# Patient Record
Sex: Female | Born: 1970
Health system: Southern US, Community
[De-identification: ages and names within clinical notes are randomized; demographics above are authoritative.]

## PROBLEM LIST (undated history)

## (undated) DIAGNOSIS — M1990 Unspecified osteoarthritis, unspecified site: Secondary | ICD-10-CM

## (undated) DIAGNOSIS — T7840XA Allergy, unspecified, initial encounter: Secondary | ICD-10-CM

## (undated) DIAGNOSIS — J45909 Unspecified asthma, uncomplicated: Secondary | ICD-10-CM

## (undated) DIAGNOSIS — I10 Essential (primary) hypertension: Secondary | ICD-10-CM

## (undated) DIAGNOSIS — K219 Gastro-esophageal reflux disease without esophagitis: Secondary | ICD-10-CM

## (undated) HISTORY — DX: Unspecified osteoarthritis, unspecified site: M19.90

## (undated) HISTORY — DX: Gastro-esophageal reflux disease without esophagitis: K21.9

## (undated) HISTORY — DX: Unspecified asthma, uncomplicated: J45.909

## (undated) HISTORY — PX: BUNIONECTOMY: SHX129

## (undated) HISTORY — DX: Allergy, unspecified, initial encounter: T78.40XA

## (undated) HISTORY — DX: Essential (primary) hypertension: I10

## (undated) HISTORY — PX: CYST EXCISION: SHX5701

## (undated) HISTORY — PX: OTHER SURGICAL HISTORY: SHX169

## (undated) HISTORY — PX: MENISCUS REPAIR: SHX5179

---

## 2012-06-23 DIAGNOSIS — E559 Vitamin D deficiency, unspecified: Secondary | ICD-10-CM | POA: Insufficient documentation

## 2012-06-23 DIAGNOSIS — M159 Polyosteoarthritis, unspecified: Secondary | ICD-10-CM | POA: Insufficient documentation

## 2012-06-26 HISTORY — PX: OTHER SURGICAL HISTORY: SHX169

## 2012-07-14 DIAGNOSIS — Z9884 Bariatric surgery status: Secondary | ICD-10-CM | POA: Insufficient documentation

## 2012-10-20 DIAGNOSIS — M21612 Bunion of left foot: Secondary | ICD-10-CM | POA: Insufficient documentation

## 2012-10-20 DIAGNOSIS — M797 Fibromyalgia: Secondary | ICD-10-CM | POA: Insufficient documentation

## 2013-08-16 ENCOUNTER — Encounter (INDEPENDENT_AMBULATORY_CARE_PROVIDER_SITE_OTHER): Payer: Self-pay

## 2013-08-16 ENCOUNTER — Ambulatory Visit: Payer: Self-pay

## 2013-08-16 ENCOUNTER — Ambulatory Visit (INDEPENDENT_AMBULATORY_CARE_PROVIDER_SITE_OTHER): Payer: BC Managed Care – PPO

## 2013-08-16 VITALS — BP 120/74 | HR 62 | Resp 18

## 2013-08-16 DIAGNOSIS — M79672 Pain in left foot: Secondary | ICD-10-CM

## 2013-08-16 DIAGNOSIS — M79609 Pain in unspecified limb: Secondary | ICD-10-CM

## 2013-08-16 DIAGNOSIS — M7750 Other enthesopathy of unspecified foot: Secondary | ICD-10-CM

## 2013-08-16 DIAGNOSIS — Q66229 Congenital metatarsus adductus, unspecified foot: Secondary | ICD-10-CM

## 2013-08-16 DIAGNOSIS — M2010 Hallux valgus (acquired), unspecified foot: Secondary | ICD-10-CM

## 2013-08-16 NOTE — Patient Instructions (Signed)
Bunionectomy A bunionectomy is surgery to remove a bunion. A bunion is an enlargement of the joint at the base of the big toe. It is made up of bone and soft tissue on the inside part of the joint. Over time, a painful lump appears on the inside of the joint. The big toe begins to point inward toward the second toe. New bone growth can occur and a bone spur may form. The pain eventually causes difficulty walking. A bunion usually results from inflammation caused by the irritation of poorly fitting shoes. It often begins later in life. A bunionectomy is performed when nonsurgical treatment no longer works. When surgery is needed, the extent of the procedure will depend on the degree of deformity of the foot. Your surgeon will discuss with you the different procedures and what will work best for you depending on your age and health. LET YOUR CAREGIVER KNOW ABOUT:   Previous problems with anesthetics or medicines used to numb the skin.  Allergies to dyes, iodine, foods, and/or latex.  Medicines taken including herbs, eye drops, prescription medicines (especially medicines used to "thin the blood"), aspirin and other over-the-counter medicines, and steroids (by mouth or as a cream).  History of bleeding or blood problems.  Possibility of pregnancy, if this applies.  History of blood clots in your legs and/or lungs .  Previous surgery.  Other important health problems. RISKS AND COMPLICATIONS   Infection.  Pain.  Nerve damage.  Possibility that the bunion will recur. BEFORE THE PROCEDURE  You should be present 60 minutes prior to your procedure or as directed.  PROCEDURE  Surgery is often done so that you can go home the same day (outpatient). It may be done in a hospital or in an outpatient surgical center. An anesthetic will be used to help you sleep during the procedure. Sometimes, a spinal anesthetic is used to make you numb below the waist. A cut (incision) is made over the swollen  area at the first joint of the big toe. The enlarged lump will be removed. If there is a need to reposition the bones of the big toe, this may require more than 1 incision. The bone itself may need to be cut. Screws and wires may be used in the repair. These can be removed at a later date. In severe cases, the entire joint may need to be removed and a joint replacement inserted. When done, the incision is closed with stitches (sutures). Skin adhesive strips may be added for reinforcement. They help hold the incision closed.  AFTER THE PROCEDURE  Compression bandages (dressings) are then wrapped around the wound. This helps to keep the foot in alignment and reduce swelling. Your foot will be monitored for bleeding and swelling. You will need to stay for a few hours in the recovery area before being discharged. This allows time for the anesthesia to wear off. You will be discharged home when you are awake, stable, and doing well. HOME CARE INSTRUCTIONS   You can expect to return to normal activities within 6 to 8 weeks after surgery. The foot is at increased risk for swelling for several months. When you can expect to bear weight on the operated foot will depend on the extent of your surgery. The milder the deformity, the less tissue is removed and the sooner the return to normal activity level. During the recovery period, a special shoe, boot, or cast may be worn to accommodate the surgical bandage and to help provide stability   to the foot.  Once you are home, an ice pack applied to the operative site may help with discomfort and keep swelling down. Stop using the ice if it causes discomfort.  Keep your feet raised (elevated) when possible to lessen swelling.  If you have an elastic bandage on your foot and you have numbness, tingling, or your foot becomes cold and blue, adjust the bandage to make it comfortable.  Change dressings as directed.  Keep the wound dry and clean. The wound may be washed  gently with soap and water. Gently blot dry without rubbing. Do not take baths or use swimming pools or hot tubs for 10 days, or as instructed by your caregiver.  Only take over-the-counter or prescription medicines for pain, discomfort, or fever as directed by your caregiver.  You may continue a normal diet as directed.  For activity, use crutches with no weight bearing or your orthopedic shoe as directed. Continue to use crutches or a cane as directed until you can stand without causing pain. SEEK MEDICAL CARE IF:   You have redness, swelling, bruising, or increasing pain in the wound.  There is pus coming from the wound.  You have drainage from a wound lasting longer than 1 day.  You have an oral temperature above 102 F (38.9 C).  You notice a bad smell coming from the wound or dressing.  The wound breaks open after sutures have been removed.  You develop dizzy episodes or fainting while standing.  You have persistent nausea or vomiting.  Your toes become cold.  Pain is not relieved with medicines. SEEK IMMEDIATE MEDICAL CARE IF:   You develop a rash.  You have difficulty breathing.  You develop any reaction or side effects to medicines given.  Your toes are numb or blue, or you have severe pain. MAKE SURE YOU:   Understand these instructions.  Will watch your condition.  Will get help right away if you are not doing well or get worse. Document Released: 10/01/2005 Document Revised: 01/10/2012 Document Reviewed: 11/06/2007 [facility] Patient Information 2014 [place], [place]. Bunion You have a bunion deformity of the feet. This is more common in women. It tends to be an inherited problem. Symptoms can include pain, swelling, and deformity around the great toe. Numbness and tingling may also be present. Your symptoms are often worsened by wearing shoes that cause pressure on the bunion. Changing the type of shoes you wear helps reduce symptoms. A wide shoe decreases  pressure on the bunion. An arch support may be used if you have flat feet. Avoid shoes with heels higher than two inches. This puts more pressure on the bunion. X-rays may be helpful in evaluating the severity of the problem. Other foot problems often seen with bunions include corns, calluses, and hammer toes. If the deformity or pain is severe, surgical treatment may be necessary. Keep off your painful foot as much as possible until the pain is relieved. Call your caregiver if your symptoms are worse.  SEEK IMMEDIATE MEDICAL CARE IF:  You have increased redness, pain, swelling, or other symptoms of infection. Document Released: 10/18/2005 Document Revised: 01/10/2012 Document Reviewed: 04/17/2007 [facility] Patient Information 2014 [place], [place].

## 2013-08-16 NOTE — Progress Notes (Signed)
  Subjective:    Patient ID: Elizabeth Thornton, female    DOB: 03-Aug-1971, 42 y.o.   MRN: [id number]  HPI bunions are both feet, burns and throbs and redness and hurts to wear shoes and has been going on for the last four years    Review of Systems     Objective:   Physical Exam  Constitutional: She is oriented to person, place, and time. She appears well-developed and well-nourished.  Cardiovascular:  Pulses:      Dorsalis pedis pulses are 2+ on the right side, and 2+ on the left side.       Posterior tibial pulses are 2+ on the right side, and 2+ on the left side.  Capillary refill timed 3-4 seconds all digits bilateral. Skin temperature warm bilateral turgor normal. There is no edema or varicosities noted.  Musculoskeletal:  Orthopedic biomechanical exam lower extremity reveals mild to moderate HAV deformity with prominence of the first MTP joint medially. This painful and tender with enclosed shoes, walking, and on range of motion evaluation. The foot is also significant for clinical met adductus findings patient has a generalized C-shaped foot appearance. Should also note patient has a genu valgum deformity associated with a history of knee problems as well.  Neurological: She is alert and oriented to person, place, and time. She has normal strength.  Epicritic and proprioceptive sensations intact and symmetric bilateral. DTRs not elicited. There is normal plantar response bilateral.  Skin: Skin is warm and dry. No cyanosis. Nails show no clubbing.  Skin color pigment normal hair growth diminished absent bilateral. No open wounds or lacerations noted. Slight erythema the first MTP. Bilateral  Psychiatric: She has a normal mood and affect. Her behavior is normal.          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 Patient also significant metatarsus adductus deformity bilateral. Generalized pain around first MTP area bilateral with  capsulitis and mild last arthropathy. These deviation and subluxation of the sesamoids and radiographs. At this time discussed options possible simple bunionectomy Rosten bunionectomy may alleviate her problems as far as the bunion pain however we'll not address the method that is deformities. Should note the patient has a windswept appearance to her toes with medial deviation of all digits except for hallux which or laterally deviated. Literature on bunion and surgery is dispensed. Patient will followup on an as-needed basis understands that surgery will likely need to be out of work for 6 weeks. Will call us when she is ready for surgical consult. Did suggest using topical pain application such as a salon pas, or Aspercreme.  Alvan Dame DPM

## 2014-03-06 ENCOUNTER — Telehealth: Payer: Self-pay | Admitting: *Deleted

## 2014-03-06 NOTE — Telephone Encounter (Signed)
He said I have bunions on both feet and that my feet are c-shaped.  I'm ready to do something about this.  I have some questions.  Do I need to set up another appointment?  What will insurance cover?  Give me a call back.

## 2014-03-07 NOTE — Telephone Encounter (Signed)
Patient needs to come in and see the doctor/lisa

## 2014-03-13 ENCOUNTER — Telehealth: Payer: Self-pay | Admitting: *Deleted

## 2014-03-13 NOTE — Telephone Encounter (Signed)
I want to schedule surgery.

## 2014-03-15 NOTE — Telephone Encounter (Signed)
Left message for pt to call me will need to come in and see Dr Ralene CorkSikora again to discuss surgery and sign consents

## 2014-03-20 NOTE — Telephone Encounter (Signed)
03/20/14 4:39pm  Attempted to call pt again left 2nd message to call office to set up appt

## 2014-03-22 NOTE — Telephone Encounter (Signed)
Patient called and scheduled appointment to see Dr Ralene Cork

## 2014-04-04 ENCOUNTER — Ambulatory Visit (INDEPENDENT_AMBULATORY_CARE_PROVIDER_SITE_OTHER): Payer: BC Managed Care – PPO

## 2014-04-04 VITALS — BP 122/81 | HR 59 | Resp 18

## 2014-04-04 DIAGNOSIS — M778 Other enthesopathies, not elsewhere classified: Secondary | ICD-10-CM

## 2014-04-04 DIAGNOSIS — M779 Enthesopathy, unspecified: Secondary | ICD-10-CM

## 2014-04-04 DIAGNOSIS — M79609 Pain in unspecified limb: Secondary | ICD-10-CM

## 2014-04-04 DIAGNOSIS — M79672 Pain in left foot: Secondary | ICD-10-CM

## 2014-04-04 DIAGNOSIS — Q66229 Congenital metatarsus adductus, unspecified foot: Secondary | ICD-10-CM

## 2014-04-04 DIAGNOSIS — M7750 Other enthesopathy of unspecified foot: Secondary | ICD-10-CM

## 2014-04-04 DIAGNOSIS — M2010 Hallux valgus (acquired), unspecified foot: Secondary | ICD-10-CM

## 2014-04-04 NOTE — Progress Notes (Signed)
   Subjective:    Patient ID: Elizabeth Thornton, female    DOB: January 24, 1971, 43 y.o.   MRN: [id number]  HPI my left foot is worse but they both hurt and throbbing and numbness and tingling and there is no swelling and I want to talk about surgery    Review of Systems no new findings or systemic changes noted. Patient continues to have bunion deformity prominent on both feet as well as significant metatarsus adductus deformity contributing to the overall deformity of the foot.    Objective:   Physical Exam Neurovascular status is intact pedal pulses palpable patient continues to have painful bunion deformities right is actually slightly worse than left however left more painful or symptomatic at this time. Patient does have some generalized pain in the Lisfranc area dorsal midfoot and rear foot area time for gait ambulation been going on for many years however is diffuse and otherwise tolerated by patient however most significant pain is the bump in medial eminence pain in the first metatarsal for visual joint bilateral left more so than right x-rays confirm elevated I am angle greater than 12 deviation of sesamoids there is significant met adductus as well however we discussed complete treatment would involve adductus repair as well as bunion repair however it and the prolonged recovery nonweightbearing up to 3 months the boot and significant down time however suspicion or symptomology the only that of bunion pain recommendation is times of the [facility] bunionectomy with isolated first metatarsal osteotomy and pin or screw fixation to be done under IV sedation outpatient basis with local and regional anesthetic block. Patient is amendable to the lateral procedure including Austin bunionectomy be done an outpatient for correction of painful bunion deformity of the left foot.       Assessment & Plan:  Assessment possible abductovalgus deformity as well as metatarsus adductus deformity both feet left more  painful tender symptomatic at this time does have persistent capsulitis of the first MTP joint. Consent form for [facility] with screw or pin fixations reviewed and signed at this time all questions asked medication are answered there no complications and surgery scheduled her convenience with primary postop followup as far as pain management patient is status post GI gastric bypass and has allergy to codeine including derivatives. Cannot take codeine hydrocodone or oxycodone. Will discuss with her GI doctor the possibilities of using tramadol or short-term of meloxicam versus plain Tylenol for postop pain management patient will inform us on the choices at the time of surgery. At this time and consent form is reviewed and signed all questions asked medication are answered and surgery scheduled her convenience. She understands she'll be in air fracture boot or candidate for approximately one-month duration 5-6 weeks total she will probably be off her work activities for 6-8 week duration to next  [employer] DPM

## 2014-04-04 NOTE — Patient Instructions (Signed)
Pre-Operative Instructions  Congratulations, you have decided to take an important step to improving your quality of life.  You can be assured that the doctors of [facility] will be with you every step of the way.  1. Plan to be at the surgery center/hospital at least 1 (one) hour prior to your scheduled time unless otherwise directed by the surgical center/hospital staff.  You must have a responsible adult accompany you, remain during the surgery and drive you home.  Make sure you have directions to the surgical center/hospital and know how to get there on time. 2. For hospital based surgery you will need to obtain a history and physical form from your family physician within 1 month prior to the date of surgery- we will give you a form for you primary physician.  3. We make every effort to accommodate the date you request for surgery.  There are however, times where surgery dates or times have to be moved.  We will contact you as soon as possible if a change in schedule is required.   4. No Aspirin/Ibuprofen for one week before surgery.  If you are on aspirin, any non-steroidal anti-inflammatory medications (Mobic, Aleve, Ibuprofen) you should stop taking it 7 days prior to your surgery.  You make take Tylenol  For pain prior to surgery.  5. Medications- If you are taking daily heart and blood pressure medications, seizure, reflux, allergy, asthma, anxiety, pain or diabetes medications, make sure the surgery center/hospital is aware before the day of surgery so they may notify you which medications to take or avoid the day of surgery. 6. No food or drink after midnight the night before surgery unless directed otherwise by surgical center/hospital staff. 7. No alcoholic beverages 24 hours prior to surgery.  No smoking 24 hours prior to or 24 hours after surgery. 8. Wear loose pants or shorts- loose enough to fit over bandages, boots, and casts. 9. No slip on shoes, sneakers are best. 10. Bring  your boot with you to the surgery center/hospital.  Also bring crutches or a walker if your physician has prescribed it for you.  If you do not have this equipment, it will be provided for you after surgery. 11. If you have not been contracted by the surgery center/hospital by the day before your surgery, call to confirm the date and time of your surgery. 12. Leave-time from work may vary depending on the type of surgery you have.  Appropriate arrangements should be made prior to surgery with your employer. 13. Prescriptions will be provided immediately following surgery by your doctor.  Have these filled as soon as possible after surgery and take the medication as directed. 14. Remove nail polish on the operative foot. 15. Wash the night before surgery.  The night before surgery wash the foot and leg well with the antibacterial soap provided and water paying special attention to beneath the toenails and in between the toes.  Rinse thoroughly with water and dry well with a towel.  Perform this wash unless told not to do so by your physician.  Enclosed: 1 Ice pack (please put in freezer the night before surgery)   1 Hibiclens skin cleaner   Pre-op Instructions  If you have any questions regarding the instructions, do not hesitate to call our office.  [place]: [address] [place], [place] [address] [phone]  [place]: [address] [place], [place] [address] [phone]  [place]: [address]  [place], [place] [address] [phone]  Dr. Shenita Trego   Tuchman DPM, Dr. Cristie Hem DPM Dr. Alvan Dame DPM, Dr. Ardelle Anton DPM, Dr. Marlowe Aschoff DPM     Check with her GI doctor or physicians managing her weight is doing her bariatric care for possible choices for postop pain management Consider the options of using meloxicam 15 mg once daily as and mild NSAIDs for short-term For more severe pain the option using tramadol 50 mg one every 6 hours when necessary pain For more mild or  generalized pain plain Tylenol would be acceptable.

## 2014-06-03 DIAGNOSIS — M2010 Hallux valgus (acquired), unspecified foot: Secondary | ICD-10-CM

## 2014-06-04 ENCOUNTER — Telehealth: Payer: Self-pay

## 2014-06-04 NOTE — Telephone Encounter (Signed)
Spoke with pt regarding post operative status,she states that she is managing her pain very well, and has foot elevated. Advised to wear the boot at all times and keep sterile dressing dry

## 2014-06-06 ENCOUNTER — Ambulatory Visit (INDEPENDENT_AMBULATORY_CARE_PROVIDER_SITE_OTHER): Payer: BC Managed Care – PPO

## 2014-06-06 VITALS — BP 116/78 | HR 76 | Resp 18

## 2014-06-06 DIAGNOSIS — Z09 Encounter for follow-up examination after completed treatment for conditions other than malignant neoplasm: Secondary | ICD-10-CM

## 2014-06-06 DIAGNOSIS — M778 Other enthesopathies, not elsewhere classified: Secondary | ICD-10-CM

## 2014-06-06 DIAGNOSIS — Q66229 Congenital metatarsus adductus, unspecified foot: Secondary | ICD-10-CM

## 2014-06-06 DIAGNOSIS — M2010 Hallux valgus (acquired), unspecified foot: Secondary | ICD-10-CM

## 2014-06-06 DIAGNOSIS — M779 Enthesopathy, unspecified: Secondary | ICD-10-CM

## 2014-06-06 DIAGNOSIS — M7750 Other enthesopathy of unspecified foot: Secondary | ICD-10-CM

## 2014-06-06 NOTE — Progress Notes (Signed)
Subjective:    Patient ID: Elizabeth Thornton, female    DOB: Aug 03, 1971, 43 y.o.   MRN: [id number]  HPI I HAD SURGERY ON 06/03/14 ON MY LEFT FOOT AND IT IS OK AND THE TRAMADOL MADE ME ITCH AND THE ANTIBOTIC MADE MY DIZZY AND NAUSEA    Review of Systems no new findings or systemic changes noted     Objective:   Physical Exam Neurovascular status is intact pedal pulses palpable incision clean dry well coapted dressings were intact and dry patient is been nonweightbearing having misunderstood instructions is using crutches and nonweightbearing at this time patient by she can weight-bear with the air fracture boot in place may discontinue use of the crutches. X-rays reveal good position of the osteotomy intact fixation good alignment of the osteotomy noted clinically incision well coapted good range of motion for flexion plantar flexion the hallux is noted toe appears rectus position or only complaint having some tightness a bandage along the fifth metatarsal area causing irritation. Also is having suggested from her pain medications and antibiotic medications which are RE been discontinued. Mild edema and ecchymosis noted consistent with postop course       Assessment & Plan:  Assessment good postop progress presto compressive dressing reapplied at this time reappointed in one week for dressing change in followup maintain Tylenol as needed for pain elevate and use ice pack when necessary remove the boot when needed allergies to maintain the boot for ambulating or walking or during sleep. Recheck in one week for postop followup  Alvan Dame DPM

## 2014-06-06 NOTE — Patient Instructions (Signed)

## 2014-06-07 NOTE — Progress Notes (Signed)
Dr Ralene CorkSikora performed a Left Austin bunionectomy on 06/03/14, he prescribed tramadol 50mg  #50 1-2 tabs q6hrs prn pain Cephalexin500mg  #10 Q6hrs

## 2014-06-13 ENCOUNTER — Ambulatory Visit (INDEPENDENT_AMBULATORY_CARE_PROVIDER_SITE_OTHER): Payer: BC Managed Care – PPO

## 2014-06-13 VITALS — BP 109/67 | HR 57 | Resp 18

## 2014-06-13 DIAGNOSIS — Z09 Encounter for follow-up examination after completed treatment for conditions other than malignant neoplasm: Secondary | ICD-10-CM

## 2014-06-13 DIAGNOSIS — M2010 Hallux valgus (acquired), unspecified foot: Secondary | ICD-10-CM

## 2014-06-13 NOTE — Patient Instructions (Signed)

## 2014-06-13 NOTE — Progress Notes (Signed)
Subjective:    Patient ID: Elizabeth Thornton, female    DOB: 10/14/1971, 43 y.o.   MRN: [id number]  HPI I HAD SURGERY ON 06/03/14 ON MY LEFT FOOT AND IT IS DOING GOOD      Review of Systems no new findings or systemic changes noted    Objective:   Physical Exam Lower extremity objective findings reveal neurovascular status intact good postop progress incision clean dry well coapted sutures intact as was removed at this time patient may resume normal bathing and hygiene suggested Neosporin cocoa butter to the incision or the dermal as needed continue with active passive range of motion exercises of the great toe joint at least 100 toe pushup today. No discharge no drainage no sign of dehiscence good range of motion proximally 50 dorsiflexion 510 plantar flexion tenderness the joint consistent with postop course and edema      Assessment & Plan:  Assessment good postop progress presto dressing removed dispensed anklet to maintain compression of the foot during the day we've the anklet off at night continue with range of motion exercises recheck in 4 weeks for long-term followup x-ray plan to D/C boot after next visit.  Alvan Dame DPM

## 2014-07-11 ENCOUNTER — Ambulatory Visit (INDEPENDENT_AMBULATORY_CARE_PROVIDER_SITE_OTHER): Payer: BC Managed Care – PPO

## 2014-07-11 VITALS — BP 97/62 | HR 66 | Resp 18

## 2014-07-11 DIAGNOSIS — M779 Enthesopathy, unspecified: Secondary | ICD-10-CM

## 2014-07-11 DIAGNOSIS — Z09 Encounter for follow-up examination after completed treatment for conditions other than malignant neoplasm: Secondary | ICD-10-CM

## 2014-07-11 DIAGNOSIS — M7750 Other enthesopathy of unspecified foot: Secondary | ICD-10-CM

## 2014-07-11 DIAGNOSIS — M778 Other enthesopathies, not elsewhere classified: Secondary | ICD-10-CM

## 2014-07-11 DIAGNOSIS — M2010 Hallux valgus (acquired), unspecified foot: Secondary | ICD-10-CM

## 2014-07-11 NOTE — Progress Notes (Signed)
   Subjective:    Patient ID: Elizabeth Thornton, female    DOB: 1970-12-22, 43 y.o.   MRN: [id number]  HPI I AM DOING GOOD ON MY LEFT FOOT AND THERE IS SOME NUMBNESS AND SWELLING AND I HAVE NOTICED THAT MY FOOT GETS A LITTLE PURPLISH COLOR WHEN I HAVE MY LEG DOWN BUT IF I HAVE IT UP I DON'T SEE THE PURPLISH COLOR    Review of Systems no new findings or systemic changes noted     Objective:   Physical Exam Neurovascular status is intact pedal pulses are palpable epicritic and proprioceptive sensations intact incision well coapted still slight erythema the incision area and some slight paresthesia consistent with postop course fungal numbness just distal to the incision of the hallux is noted pain or discomfort has excellent range of motion proximally 50 dorsiflexion or more 10 plantar flexion no crepitus continue with active and passive range of motion exercises as instructed x-rays reveal good alignment of the osteotomy and K wire fixation patient does have significant met adductus which is noted and she is aware that aspect likely no change        Assessment & Plan:  Assessment good postop progress following Austin bunionectomy left foot great toe joint is function will continue with active passive range of motion exercises discontinue air fracture boot at this time may resume walking tennis or athletic shoes using a Brooks running shoe can return to work with the next 2 weeks as scheduled at this time patient is advised no running ballistic activities or high-impact no high heels or dress shoes for leaks 2 more months maintain A. shoes athletic or walking shoe for the next 2 months scheduled for followup at this time  Harriet Masson DPM

## 2014-07-11 NOTE — Patient Instructions (Signed)

## 2014-09-13 ENCOUNTER — Ambulatory Visit (INDEPENDENT_AMBULATORY_CARE_PROVIDER_SITE_OTHER): Payer: BC Managed Care – PPO

## 2014-09-13 VITALS — BP 103/64 | HR 56 | Resp 12

## 2014-09-13 DIAGNOSIS — M2012 Hallux valgus (acquired), left foot: Secondary | ICD-10-CM

## 2014-09-13 DIAGNOSIS — Z09 Encounter for follow-up examination after completed treatment for conditions other than malignant neoplasm: Secondary | ICD-10-CM

## 2014-09-13 DIAGNOSIS — M2010 Hallux valgus (acquired), unspecified foot: Secondary | ICD-10-CM

## 2014-09-13 DIAGNOSIS — Q66229 Congenital metatarsus adductus, unspecified foot: Secondary | ICD-10-CM

## 2014-09-13 DIAGNOSIS — Q662 Congenital metatarsus (primus) varus: Secondary | ICD-10-CM

## 2014-09-13 NOTE — Progress Notes (Signed)
Subjective:    Patient ID: Elizabeth Thornton, female    DOB: 1971/06/27, 43 y.o.   MRN: [id number]  HPI   ''LT FOOT STILL SORE BUT IS DOING MUCH BETTER.''  Review of Systems no new findings or systemic changes noted still some mild postoperative edema present     Objective:   Physical Exam Neurovascular status is intact and unchanged patient still has some slight met adductus bunion correction is adequate incision is well coapted there is still some tenderness along the MTP joint right great toe joint mild postoperative edema still present x-rays reveal good alignment of the osteotomy intact fixation no displacement has excellent range of motion approximately 43 year old more degrees dorsiflexion 10-15 plantar flexion with no crepitus although there is tenderness on palpation. Patient Elizabeth Thornton just worked 4 consecutive 12 hour shifts so her feet are deathly swollen and somewhat more tender today than usual. Patient isfunctioning without restrictions       Assessment & Plan:  Assessment good postop progress fungal [place] bunionectomy left foot maintain good stable shoes may be candidate for orthoses in the future if needed otherwise wearing Birkenstock's or crocs or something similar is using occasional anti-inflammatory or NSAID as needed for pain is charge to an as-needed basis for any future follow-up  Elizabeth Thornton DPM  Subjective:    Patient ID: Elizabeth Thornton, female    DOB: 11/27/1970, 43 y.o.   MRN: [id number]  HPI   ''LT FOOT STILL SORE BUT IS DOING MUCH BETTER.''  Review of Systems no new findings or systemic changes noted still some mild postoperative edema present     Objective:   Physical Exam Neurovascular status is intact and unchanged patient still has some slight met adductus bunion correction is adequate incision is well coapted there is still some tenderness along the MTP joint right great toe joint mild postoperative edema still present x-rays reveal good alignment of the osteotomy intact fixation no displacement has excellent range of motion approximately 58-year-old more degrees dorsiflexion 10-15 plantar flexion with no crepitus although there is tenderness on palpation. Patient Elizabeth Thornton just worked 4 consecutive 12 hour shifts so her feet are deathly swollen and somewhat more tender today than usual. Patient isfunctioning without restrictions       Assessment & Plan:  Assessment good postop progress fungal [place] bunionectomy left foot maintain good stable shoes may be candidate for orthoses in the future if needed otherwise wearing Birkenstock's or crocs or something similar is using occasional anti-inflammatory or NSAID as needed for pain is charge to an as-needed basis for any future follow-up    DPM 

## 2014-09-13 NOTE — Patient Instructions (Signed)

## 2016-06-22 ENCOUNTER — Other Ambulatory Visit: Payer: Self-pay | Admitting: Occupational Medicine

## 2016-06-22 ENCOUNTER — Ambulatory Visit: Payer: Self-pay

## 2016-06-22 DIAGNOSIS — M25561 Pain in right knee: Secondary | ICD-10-CM

## 2016-08-02 ENCOUNTER — Ambulatory Visit (INDEPENDENT_AMBULATORY_CARE_PROVIDER_SITE_OTHER): Payer: BLUE CROSS/BLUE SHIELD | Admitting: Orthopedic Surgery

## 2016-08-02 DIAGNOSIS — M1711 Unilateral primary osteoarthritis, right knee: Secondary | ICD-10-CM | POA: Diagnosis not present

## 2016-08-03 ENCOUNTER — Other Ambulatory Visit (INDEPENDENT_AMBULATORY_CARE_PROVIDER_SITE_OTHER): Payer: Self-pay | Admitting: Orthopedic Surgery

## 2016-08-03 ENCOUNTER — Ambulatory Visit (HOSPITAL_BASED_OUTPATIENT_CLINIC_OR_DEPARTMENT_OTHER)
Admission: RE | Admit: 2016-08-03 | Discharge: 2016-08-03 | Disposition: A | Discharge status: Home / Self Care | Payer: BLUE CROSS/BLUE SHIELD | Source: Ambulatory Visit

## 2016-08-03 ENCOUNTER — Emergency Department (HOSPITAL_COMMUNITY)
Admission: EM | Admit: 2016-08-03 | Discharge: 2016-08-03 | Disposition: A | Discharge status: Home / Self Care | Payer: BLUE CROSS/BLUE SHIELD | Attending: Emergency Medicine | Admitting: Emergency Medicine

## 2016-08-03 ENCOUNTER — Encounter (HOSPITAL_COMMUNITY): Payer: Self-pay | Admitting: Emergency Medicine

## 2016-08-03 DIAGNOSIS — Z79899 Other long term (current) drug therapy: Secondary | ICD-10-CM | POA: Insufficient documentation

## 2016-08-03 DIAGNOSIS — I82431 Acute embolism and thrombosis of right popliteal vein: Secondary | ICD-10-CM

## 2016-08-03 DIAGNOSIS — M79606 Pain in leg, unspecified: Secondary | ICD-10-CM | POA: Diagnosis not present

## 2016-08-03 DIAGNOSIS — J45909 Unspecified asthma, uncomplicated: Secondary | ICD-10-CM | POA: Insufficient documentation

## 2016-08-03 DIAGNOSIS — M7989 Other specified soft tissue disorders: Principal | ICD-10-CM

## 2016-08-03 DIAGNOSIS — I82401 Acute embolism and thrombosis of unspecified deep veins of right lower extremity: Secondary | ICD-10-CM | POA: Diagnosis not present

## 2016-08-03 DIAGNOSIS — O2230 Deep phlebothrombosis in pregnancy, unspecified trimester: Secondary | ICD-10-CM

## 2016-08-03 LAB — BASIC METABOLIC PANEL
ANION GAP: 4 — AB (ref 5–15)
BUN: 11 mg/dL (ref 6–20)
CALCIUM: 9.4 mg/dL (ref 8.9–10.3)
CHLORIDE: 107 mmol/L (ref 101–111)
CO2: 27 mmol/L (ref 22–32)
Creatinine, Ser: 0.69 mg/dL (ref 0.44–1.00)
GFR calc non Af Amer: >60 mL/min (ref >60)
Glucose, Bld: 80 mg/dL (ref 65–99)
Potassium: 4 mmol/L (ref 3.5–5.1)
Sodium: 138 mmol/L (ref 135–145)

## 2016-08-03 LAB — I-STAT BETA HCG BLOOD, ED (MC, WL, AP ONLY): I-stat hCG, quantitative: <5 m[IU]/mL (ref <5)

## 2016-08-03 LAB — CBC
HEMATOCRIT: 36.1 % (ref 36.0–46.0)
HEMOGLOBIN: 11.8 g/dL — AB (ref 12.0–15.0)
MCH: 28.2 pg (ref 26.0–34.0)
MCHC: 32.7 g/dL (ref 30.0–36.0)
MCV: 86.4 fL (ref 78.0–100.0)
Platelets: 444 10*3/uL — ABNORMAL HIGH (ref 150–400)
RBC: 4.18 MIL/uL (ref 3.87–5.11)
RDW: 14.7 % (ref 11.5–15.5)
WBC: 8 10*3/uL (ref 4.0–10.5)

## 2016-08-03 LAB — PROTIME-INR
INR: 1.06
PROTHROMBIN TIME: 13.9 s (ref 11.4–15.2)

## 2016-08-03 MED ORDER — RIVAROXABAN (XARELTO) VTE STARTER PACK (15 & 20 MG): 15.0000 mg | ORAL_TABLET | Freq: Two times a day (BID) | ORAL | 0 refills | Status: DC

## 2016-08-03 NOTE — ED Triage Notes (Addendum)
Pt states she tore her meniscus in her right knee 8/21. Pt states since then, she has been having right lower leg swelling and pain. Pt had DVT study here today and pos for DVT.

## 2016-08-03 NOTE — ED Provider Notes (Signed)
[facility]-EMERGENCY DEPT Provider Note   CSN: [id number] Arrival date & time: 08/03/16  1718     History   Chief Complaint Chief Complaint  Patient presents with  . DVT    HPI Elizabeth Thornton is a 45 y.o. female with DVT of her right lower extremity. She states that she has a meniscus tear in mid August. She has been seen by Dr. August Saucerean. She has had some ongoing swelling of her right lower leg now has some pain in the right medial thigh. She was sent for a Doppler study today and it revealed a. DVT of the right lower extremity and the right popliteal, posterior tibial, and peroneal veins. She has not had any chest pain or dyspnea. She has not had any previous D DTs. Her mother had antithrombin III deficiency and she has been tested in the past and it was negative.   HPI  Past Medical History:  Diagnosis Date  . Allergy   . Arthritis   . Asthma     There are no active problems to display for this patient.   Past Surgical History:  Procedure Laterality Date  . gastric bipass    . 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 Authorizing Provider  acetaminophen (TGT CHILDRENS ACETAMINOPHEN) 160 MG/5ML suspension Take by mouth. 06/23/12   Historical Provider, MD  albuterol (VENTOLIN HFA) 108 (90 BASE) MCG/ACT inhaler Frequency:PRN   Dosage:90   MCG  Instructions:  Note:Dose: 90 MCG 05/08/12   Historical Provider, MD  calcium citrate-vitamin D (CITRACAL+D) 315-200 MG-UNIT per tablet Take by mouth. 06/05/12   Historical Provider, MD  Cholecalciferol (VITAMIN D-1000 MAX ST) 1000 UNITS tablet Take by mouth. 05/08/12   Historical Provider, MD  Cyanocobalamin (VITAMIN B-12 CR) 1000 MCG TBCR Take by mouth. 10/20/12   Historical Provider, MD  gabapentin (NEURONTIN) 300 MG capsule  07/26/13   Historical Provider, MD  multivitamin-iron-minerals-folic acid (CENTRUM)  chewable tablet Frequency:BID   Dosage:0.0     Instructions:  Note:Dose: 1 10/20/12   Historical Provider, MD  omeprazole (PRILOSEC) 20 MG capsule  08/03/13   Historical Provider, MD  omeprazole (PRILOSEC) 20 MG capsule Take 20 mg by mouth. 04/19/14   Historical Provider, MD    Family History Family History  Problem Relation Age of Onset  . Cancer Mother     Social History Social History  Substance Use Topics  . Smoking status: Never Smoker  . Smokeless tobacco: Never Used  . Alcohol use No     Allergies   Codeine and Eggs or egg-derived products   Review of Systems Review of Systems  All other systems reviewed and are negative.    Physical Exam Updated Vital Signs BP 122/72 (BP Location: Right Arm)   Pulse 61   Temp 98.2 F (36.8 C) (Oral)   Resp 17   Ht 5\' 2"  (1.575 m)   Wt 78.5 kg   LMP 07/19/2016   SpO2 100%   BMI 31.64 kg/m   Physical Exam  Constitutional: She is oriented to person, place, and time. She appears well-developed and well-nourished.  HENT:  Head: Normocephalic.  Eyes: Pupils are equal, round, and reactive to light.  Neck: Normal range of motion.  Cardiovascular: Normal rate and regular rhythm.   Pulmonary/Chest: Effort normal and breath  sounds normal.  Abdominal: Soft. Bowel sounds are normal.  Musculoskeletal: Normal range of motion.  Right calf swollen with mild ttp, dp intact  Neurological: She is alert and oriented to person, place, and time.  Skin: Skin is warm. Capillary refill takes less than 2 seconds.  Psychiatric: She has a normal mood and affect.  Nursing note and vitals reviewed.    ED Treatments / Results  Labs (all labs ordered are listed, but only abnormal results are displayed) Labs Reviewed  CBC  BASIC METABOLIC PANEL  PROTIME-INR  I-STAT BETA HCG BLOOD, ED ([facility], [facility], [facility] ONLY)    EKG  EKG Interpretation None       Radiology No results found.  Procedures Procedures (including critical care  time)  Medications Ordered in ED Medications - No data to display   Initial Impression / Assessment and Plan / ED Course  I have reviewed the triage vital signs and the nursing notes.  Pertinent labs & imaging results that were available during my care of the patient were reviewed by me and considered in my medical decision making (see chart for details).  Clinical Course    45 year old female with DVT right lower extremity. Will check labs and if normal will start rivoraxaban an outpatient follow-up with her primary care physician  Final Clinical Impressions(s) / ED Diagnoses   Final diagnoses:  DVT (deep vein thrombosis) in pregnancy [facility])    New Prescriptions New Prescriptions   No medications on file     Margarita Grizzle, MD 08/04/16 1544

## 2016-08-03 NOTE — Progress Notes (Signed)
*  PRELIMINARY RESULTS* Vascular Ultrasound Right lower extremity venous duplex has been completed.  Preliminary findings: DVT noted in the right popliteal, posterior tibial, and peroneal veins.   Called results to Dr. August Saucerean. He instructed that patient go to ED for evaluation. 5:11 PM    Elizabeth DemarkJill Eunice, RDMS, RVT  08/03/2016

## 2016-08-05 ENCOUNTER — Ambulatory Visit (INDEPENDENT_AMBULATORY_CARE_PROVIDER_SITE_OTHER): Payer: BLUE CROSS/BLUE SHIELD | Admitting: Orthopedic Surgery

## 2016-08-05 DIAGNOSIS — M1711 Unilateral primary osteoarthritis, right knee: Secondary | ICD-10-CM | POA: Diagnosis not present

## 2016-08-11 ENCOUNTER — Ambulatory Visit (INDEPENDENT_AMBULATORY_CARE_PROVIDER_SITE_OTHER): Payer: BLUE CROSS/BLUE SHIELD | Admitting: Orthopedic Surgery

## 2016-08-11 DIAGNOSIS — M1711 Unilateral primary osteoarthritis, right knee: Secondary | ICD-10-CM

## 2016-08-18 ENCOUNTER — Ambulatory Visit (INDEPENDENT_AMBULATORY_CARE_PROVIDER_SITE_OTHER): Payer: BLUE CROSS/BLUE SHIELD | Admitting: Orthopedic Surgery

## 2016-08-18 DIAGNOSIS — M1711 Unilateral primary osteoarthritis, right knee: Secondary | ICD-10-CM

## 2016-08-23 ENCOUNTER — Ambulatory Visit (INDEPENDENT_AMBULATORY_CARE_PROVIDER_SITE_OTHER): Payer: Self-pay | Admitting: Orthopedic Surgery

## 2016-08-24 ENCOUNTER — Ambulatory Visit: Payer: Self-pay | Admitting: Nurse Practitioner

## 2016-08-26 ENCOUNTER — Encounter: Payer: Self-pay | Admitting: Nurse Practitioner

## 2016-08-26 ENCOUNTER — Ambulatory Visit (INDEPENDENT_AMBULATORY_CARE_PROVIDER_SITE_OTHER): Payer: BLUE CROSS/BLUE SHIELD | Admitting: Nurse Practitioner

## 2016-08-26 VITALS — BP 116/70 | HR 64 | Temp 98.4°F | Resp 16 | Ht 62.0 in | Wt 175.0 lb

## 2016-08-26 DIAGNOSIS — Z23 Encounter for immunization: Secondary | ICD-10-CM | POA: Diagnosis not present

## 2016-08-26 DIAGNOSIS — M797 Fibromyalgia: Secondary | ICD-10-CM

## 2016-08-26 DIAGNOSIS — I824Z1 Acute embolism and thrombosis of unspecified deep veins of right distal lower extremity: Secondary | ICD-10-CM

## 2016-08-26 DIAGNOSIS — M1712 Unilateral primary osteoarthritis, left knee: Secondary | ICD-10-CM | POA: Insufficient documentation

## 2016-08-26 DIAGNOSIS — I82409 Acute embolism and thrombosis of unspecified deep veins of unspecified lower extremity: Secondary | ICD-10-CM | POA: Insufficient documentation

## 2016-08-26 DIAGNOSIS — Z86718 Personal history of other venous thrombosis and embolism: Secondary | ICD-10-CM | POA: Insufficient documentation

## 2016-08-26 MED ORDER — RIVAROXABAN 20 MG PO TABS: 20.0000 mg | ORAL_TABLET | Freq: Every day | ORAL | 3 refills | Status: DC
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
## 2016-08-26 NOTE — Progress Notes (Signed)
Pre visit review using our clinic review tool, if applicable. No additional management support is needed unless otherwise documented below in the visit note. 
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
## 2016-08-26 NOTE — Patient Instructions (Signed)
You will be called with appt for repeat doppler. Continue xarelto once a day.  Rivaroxaban oral tablets What is this medicine? RIVAROXABAN (ri va ROX a ban) is an anticoagulant (blood thinner). It is used to treat blood clots in the lungs or in the veins. It is also used after knee or hip surgeries to prevent blood clots. It is also used to lower the chance of stroke in people with a medical condition called atrial fibrillation. This medicine may be used for other purposes; ask your health care provider or pharmacist if you have questions. What should I tell my health care provider before I take this medicine? They need to know if you have any of these conditions: -bleeding disorders -bleeding in the brain -blood in your stools (black or tarry stools) or if you have blood in your vomit -history of stomach bleeding -kidney disease -liver disease -low blood counts, like low white cell, platelet, or red cell counts -recent or planned spinal or epidural procedure -take medicines that treat or prevent blood clots -an unusual or allergic reaction to rivaroxaban, other medicines, foods, dyes, or preservatives -pregnant or trying to get pregnant -breast-feeding How should I use this medicine? Take this medicine by mouth with a glass of water. Follow the directions on the prescription label. Take your medicine at regular intervals. Do not take it more often than directed. Do not stop taking except on your doctor's advice. Stopping this medicine may increase your risk of a blood clot. Be sure to refill your prescription before you run out of medicine. If you are taking this medicine after hip or knee replacement surgery, take it with or without food. If you are taking this medicine for atrial fibrillation, take it with your evening meal. If you are taking this medicine to treat blood clots, take it with food at the same time each day. If you are unable to swallow your tablet, you may crush the tablet  and mix it in applesauce. Then, immediately eat the applesauce. You should eat more food right after you eat the applesauce containing the crushed tablet. Talk to your pediatrician regarding the use of this medicine in children. Special care may be needed. Overdosage: If you think you have taken too much of this medicine contact a poison control center or emergency room at once. NOTE: This medicine is only for you. Do not share this medicine with others. What if I miss a dose? If you take your medicine once a day and miss a dose, take the missed dose as soon as you remember. If you take your medicine twice a day and miss a dose, take the missed dose immediately. In this instance, 2 tablets may be taken at the same time. The next day you should take 1 tablet twice a day as directed. What may interact with this medicine? -aspirin and aspirin-like medicines -certain antibiotics like erythromycin, azithromycin, and clarithromycin -certain medicines for fungal infections like ketoconazole and itraconazole -certain medicines for irregular heart beat like amiodarone, quinidine, dronedarone -certain medicines for seizures like carbamazepine, phenytoin -certain medicines that treat or prevent blood clots like warfarin, enoxaparin, and dalteparin -conivaptan -diltiazem -felodipine -indinavir -lopinavir; ritonavir -NSAIDS, medicines for pain and inflammation, like ibuprofen or naproxen -ranolazine -rifampin -ritonavir -St. John's wort -verapamil This list may not describe all possible interactions. Give your health care provider a list of all the medicines, herbs, non-prescription drugs, or dietary supplements you use. Also tell them if you smoke, drink alcohol, or use illegal drugs.  Some items may interact with your medicine. What should I watch for while using this medicine? Visit your doctor or health care professional for regular checks on your progress. Your condition will be monitored carefully  while you are receiving this medicine. Notify your doctor or health care professional and seek emergency treatment if you develop breathing problems; changes in vision; chest pain; severe, sudden headache; pain, swelling, warmth in the leg; trouble speaking; sudden numbness or weakness of the face, arm, or leg. These can be signs that your condition has gotten worse. If you are going to have surgery, tell your doctor or health care professional that you are taking this medicine. Tell your health care professional that you use this medicine before you have a spinal or epidural procedure. Sometimes people who take this medicine have bleeding problems around the spine when they have a spinal or epidural procedure. This bleeding is very rare. If you have a spinal or epidural procedure while on this medicine, call your health care professional immediately if you have back pain, numbness or tingling (especially in your legs and feet), muscle weakness, paralysis, or loss of bladder or bowel control. Avoid sports and activities that might cause injury while you are using this medicine. Severe falls or injuries can cause unseen bleeding. Be careful when using sharp tools or knives. Consider using an Neurosurgeon. Take special care brushing or flossing your teeth. Report any injuries, bruising, or red spots on the skin to your doctor or health care professional. What side effects may I notice from receiving this medicine? Side effects that you should report to your doctor or health care professional as soon as possible: -allergic reactions like skin rash, itching or hives, swelling of the face, lips, or tongue -back pain -redness, blistering, peeling or loosening of the skin, including inside the mouth -signs and symptoms of bleeding such as bloody or black, tarry stools; red or dark-brown urine; spitting up blood or brown material that looks like coffee grounds; red spots on the skin; unusual bruising or bleeding  from the eye, gums, or nose Side effects that usually do not require medical attention (Report these to your doctor or health care professional if they continue or are bothersome.): -dizziness -muscle pain This list may not describe all possible side effects. Call your doctor for medical advice about side effects. You may report side effects to FDA at 1-800-FDA-1088. Where should I keep my medicine? Keep out of the reach of children. Store at room temperature between 15 and 30 degrees C (59 and 86 degrees F). Throw away any unused medicine after the expiration date. NOTE: This sheet is a summary. It may not cover all possible information. If you have questions about this medicine, talk to your doctor, pharmacist, or health care provider.    2016, [employer]vier/Gold Standard. (2014-10-16 12:45:34)

## 2016-09-28 ENCOUNTER — Ambulatory Visit (INDEPENDENT_AMBULATORY_CARE_PROVIDER_SITE_OTHER): Payer: BLUE CROSS/BLUE SHIELD | Admitting: Nurse Practitioner

## 2016-09-28 ENCOUNTER — Encounter: Payer: Self-pay | Admitting: Nurse Practitioner

## 2016-09-28 VITALS — BP 104/62 | HR 51 | Temp 98.1°F | Ht 62.0 in | Wt 175.0 lb

## 2016-09-28 DIAGNOSIS — I824Z1 Acute embolism and thrombosis of unspecified deep veins of right distal lower extremity: Secondary | ICD-10-CM | POA: Diagnosis not present

## 2016-09-28 NOTE — Progress Notes (Signed)
   Subjective:  Patient ID: Elizabeth Thornton, female    DOB: 1971/02/28  Age: 45 y.o. MRN: [id number]  CC: DVT   HPI Ms. Evetta presents for management of xarelto. Taking medication for 2months for right LE DVT. She denies any signs of bleeding. Venous doppler was not done as ordered.  Outpatient Medications Prior to Visit  Medication Sig Dispense Refill  . albuterol (VENTOLIN HFA) 108 (90 BASE) MCG/ACT inhaler Frequency:PRN   Dosage:90   MCG  Instructions:  Note:Dose: 90 MCG    . beclomethasone (QVAR) 40 MCG/ACT inhaler Inhale into the lungs as needed.    . calcium citrate-vitamin D (CITRACAL+D) 315-200 MG-UNIT per tablet Take by mouth.    . Cyanocobalamin (VITAMIN B-12 CR) 1000 MCG TBCR Take 2,500 mcg by mouth 2 (two) times daily.     Marland Kitchen. gabapentin (NEURONTIN) 300 MG capsule Take 1 capsule by mouth at bedtime.    . multivitamin-iron-minerals-folic acid (CENTRUM) chewable tablet Frequency:BID   Dosage:0.0     Instructions:  Note:Dose: 1    . omeprazole (PRILOSEC) 20 MG capsule Take 20 mg by mouth 3 times/day as needed-between meals & bedtime.     . rivaroxaban (XARELTO) 20 MG TABS tablet Take 1 tablet (20 mg total) by mouth daily with supper. 30 tablet 3   No facility-administered medications prior to visit.     ROS See HPI  Objective:  BP 104/62   Pulse (!) 51   Temp 98.1 F (36.7 C) (Oral)   Ht 5\' 2"  (1.575 m)   Wt 175 lb (79.4 kg)   SpO2 98%   BMI 32.01 kg/m   BP Readings from Last 3 Encounters:  09/28/16 104/62  08/26/16 116/70  08/03/16 120/80    Wt Readings from Last 3 Encounters:  09/28/16 175 lb (79.4 kg)  08/26/16 175 lb (79.4 kg)  08/03/16 173 lb (78.5 kg)    Physical Exam  Constitutional: She is oriented to person, place, and time. No distress.  Neck: Normal range of motion. Neck supple.  Cardiovascular: Normal rate, regular rhythm and normal heart sounds.   Pulmonary/Chest: Effort normal and breath sounds normal.  Abdominal: Soft. Bowel sounds  are normal.  Musculoskeletal: She exhibits no edema.  Neurological: She is alert and oriented to person, place, and time.  Skin: Skin is warm and dry. No rash noted. No erythema.  Vitals reviewed.   Lab Results  Component Value Date   WBC 8.0 08/03/2016   HGB 11.8 (L) 08/03/2016   HCT 36.1 08/03/2016   PLT 444 (H) 08/03/2016   GLUCOSE 80 08/03/2016   NA 138 08/03/2016   K 4.0 08/03/2016   CL 107 08/03/2016   CREATININE 0.69 08/03/2016   BUN 11 08/03/2016   CO2 27 08/03/2016   INR 1.06 08/03/2016    No results found.  Assessment & Plan:   Aliza was seen today for dvt.  Diagnoses and all orders for this visit:  Acute deep vein thrombosis (DVT) of distal vein of right lower extremity (HCC) -     VAS [place] LOWER EXTREMITY VENOUS (DVT); Future   I am having Ms. Cardy maintain her gabapentin, albuterol, calcium citrate-vitamin D, Vitamin B-12 CR, multivitamin-iron-minerals-folic acid, omeprazole, beclomethasone, and rivaroxaban.  No orders of the defined types were placed in this encounter.   Follow-up: Return in about 6 months (around 03/28/2017) for DVT and xarelto use.  Alysia Pennaharlotte Nche, NP

## 2016-09-28 NOTE — Patient Instructions (Addendum)
Continue xarelto. You will be called with appt to repeat venous doppler.   If repeat Venous doppler is negative for DVT, continue xarelto for 80month then stop. If Dvt persistent, continue xarelto for 4months.

## 2016-09-28 NOTE — Progress Notes (Signed)
Pre visit review using our clinic review tool, if applicable. No additional management support is needed unless otherwise documented below in the visit note. 

## 2016-09-30 ENCOUNTER — Ambulatory Visit (HOSPITAL_COMMUNITY): Admission: RE | Admit: 2016-09-30 | Payer: BLUE CROSS/BLUE SHIELD | Source: Ambulatory Visit

## 2016-10-04 ENCOUNTER — Encounter (HOSPITAL_COMMUNITY): Payer: Self-pay | Admitting: Nurse Practitioner

## 2016-10-06 ENCOUNTER — Ambulatory Visit: Payer: Self-pay

## 2016-10-07 ENCOUNTER — Ambulatory Visit (INDEPENDENT_AMBULATORY_CARE_PROVIDER_SITE_OTHER): Payer: BLUE CROSS/BLUE SHIELD | Admitting: General Practice

## 2016-10-07 DIAGNOSIS — Z2809 Immunization not carried out because of other contraindication: Secondary | ICD-10-CM

## 2016-10-07 DIAGNOSIS — Z23 Encounter for immunization: Secondary | ICD-10-CM | POA: Diagnosis not present

## 2016-10-26 ENCOUNTER — Ambulatory Visit (HOSPITAL_COMMUNITY)
Admission: RE | Admit: 2016-10-26 | Discharge: 2016-10-26 | Disposition: A | Discharge status: Home / Self Care | Payer: BLUE CROSS/BLUE SHIELD | Source: Ambulatory Visit | Attending: Cardiology | Admitting: Cardiology

## 2016-10-26 DIAGNOSIS — I824Z1 Acute embolism and thrombosis of unspecified deep veins of right distal lower extremity: Secondary | ICD-10-CM

## 2016-10-26 DIAGNOSIS — I825Z1 Chronic embolism and thrombosis of unspecified deep veins of right distal lower extremity: Secondary | ICD-10-CM | POA: Diagnosis not present

## 2016-12-15 ENCOUNTER — Encounter: Payer: Self-pay | Admitting: Nurse Practitioner

## 2016-12-15 DIAGNOSIS — Z9884 Bariatric surgery status: Secondary | ICD-10-CM | POA: Diagnosis not present

## 2016-12-15 DIAGNOSIS — Z79899 Other long term (current) drug therapy: Secondary | ICD-10-CM | POA: Diagnosis not present

## 2016-12-28 ENCOUNTER — Ambulatory Visit (INDEPENDENT_AMBULATORY_CARE_PROVIDER_SITE_OTHER): Payer: 59 | Admitting: Nurse Practitioner

## 2016-12-28 ENCOUNTER — Encounter: Payer: Self-pay | Admitting: Nurse Practitioner

## 2016-12-28 VITALS — BP 114/70 | HR 51 | Temp 98.0°F | Ht 62.0 in | Wt 176.0 lb

## 2016-12-28 DIAGNOSIS — R238 Other skin changes: Secondary | ICD-10-CM

## 2016-12-28 DIAGNOSIS — I824Z1 Acute embolism and thrombosis of unspecified deep veins of right distal lower extremity: Secondary | ICD-10-CM | POA: Diagnosis not present

## 2016-12-28 DIAGNOSIS — L987 Excessive and redundant skin and subcutaneous tissue: Secondary | ICD-10-CM

## 2016-12-28 MED ORDER — CORN STARCH-ZINC OXIDE 81-15 % EX POWD: 1.0000 "application " | Freq: Every day | CUTANEOUS | 0 refills | Status: DC

## 2016-12-28 NOTE — Progress Notes (Signed)
Subjective:  Patient ID: Lucita Ferraraherylanne Deleonardis, female    DOB: 01/31/1971  Age: 46 y.o. MRN: [id number]  CC: Follow-up (skin itching on abdominal area/tied OTC--nothing work)   Rash  This is a chronic problem. The current episode started more than 1 month ago. The problem has been waxing and waning since onset. The affected locations include the abdomen and groin. The rash is characterized by itchiness. Pertinent negatives include no anorexia, congestion, cough, diarrhea, eye pain, facial edema, fatigue, fever, joint pain, rhinorrhea, shortness of breath, sore throat or vomiting. Past treatments include anti-itch cream, antihistamine, moisturizer and topical steroids. The treatment provided mild relief. There is no history of allergies, asthma, eczema or varicella.  itching is worse with sweating at night. Itching has been worse since weight loss and has excess skin folds  Outpatient Medications Prior to Visit  Medication Sig Dispense Refill  . albuterol (VENTOLIN HFA) 108 (90 BASE) MCG/ACT inhaler Frequency:PRN   Dosage:90   MCG  Instructions:  Note:Dose: 90 MCG    . beclomethasone (QVAR) 40 MCG/ACT inhaler Inhale into the lungs as needed.    . calcium citrate-vitamin D (CITRACAL+D) 315-200 MG-UNIT per tablet Take by mouth.    . Cyanocobalamin (VITAMIN B-12 CR) 1000 MCG TBCR Take 2,500 mcg by mouth 2 (two) times daily.     Marland Kitchen. gabapentin (NEURONTIN) 300 MG capsule Take 1 capsule by mouth at bedtime.    . multivitamin-iron-minerals-folic acid (CENTRUM) chewable tablet Frequency:BID   Dosage:0.0     Instructions:  Note:Dose: 1    . omeprazole (PRILOSEC) 20 MG capsule Take 20 mg by mouth 3 times/day as needed-between meals & bedtime.     . rivaroxaban (XARELTO) 20 MG TABS tablet Take 1 tablet (20 mg total) by mouth daily with supper. 30 tablet 3   No facility-administered medications prior to visit.     ROS See HPI  Objective:  BP 114/70   Pulse (!) 51   Temp 98 F (36.7 C)   Ht 5\' 2"   (1.575 m)   Wt 176 lb (79.8 kg)   SpO2 99%   BMI 32.19 kg/m   BP Readings from Last 3 Encounters:  12/28/16 114/70  09/28/16 104/62  08/26/16 116/70    Wt Readings from Last 3 Encounters:  12/28/16 176 lb (79.8 kg)  09/28/16 175 lb (79.4 kg)  08/26/16 175 lb (79.4 kg)    Physical Exam  Constitutional: She is oriented to person, place, and time.  Neurological: She is alert and oriented to person, place, and time.  Skin: Skin is warm and dry. No rash noted. No erythema.       Lab Results  Component Value Date   WBC 8.0 08/03/2016   HGB 11.8 (L) 08/03/2016   HCT 36.1 08/03/2016   PLT 444 (H) 08/03/2016   GLUCOSE 80 08/03/2016   NA 138 08/03/2016   K 4.0 08/03/2016   CL 107 08/03/2016   CREATININE 0.69 08/03/2016   BUN 11 08/03/2016   CO2 27 08/03/2016   INR 1.06 08/03/2016    No results found.  Assessment & Plan:   Piedad was seen today for follow-up.  Diagnoses and all orders for this visit:  Skin irritation -     Corn Starch-Zinc Oxide 81-15 % POWD; Apply 1 application topically at bedtime.  Excess skin of abdominal wall -     Corn Starch-Zinc Oxide 81-15 % POWD; Apply 1 application topically at bedtime.  Acute deep vein thrombosis (DVT) of distal vein of right  lower extremity (HCC) -     VAS [place] LOWER EXTREMITY VENOUS (DVT); Future   I am having Ms. Mesquita start on [employer]. I am also having her maintain her gabapentin, albuterol, calcium citrate-vitamin D, Vitamin B-12 CR, multivitamin-iron-minerals-folic acid, omeprazole, beclomethasone, rivaroxaban, topiramate, Cholecalciferol, and Sodium Hyaluronate.  Meds ordered this encounter  Medications  . topiramate (TOPAMAX) 50 MG tablet    Sig: Take 50 mg by mouth 1 day or 1 dose.  . Cholecalciferol (VITAMIN D-1000 MAX ST) 1000 units tablet    Sig: Take 1,000 Units by mouth 1 day or 1 dose.  . Sodium Hyaluronate (EUFLEXXA) 20 MG/2ML SOSY    Sig: Inject 10 mg as directed.  Marland Kitchen Corn  Starch-Zinc Oxide 81-15 % POWD    Sig: Apply 1 application topically at bedtime.    Dispense:  142 g    Refill:  0    Order Specific Question:   Supervising Provider    Answer:   Tresa Garter [[id number]]    Follow-up: Return if symptoms worsen or fail to improve.  Alysia Penna, NP

## 2016-12-28 NOTE — Progress Notes (Signed)
Pre visit review using our clinic review tool, if applicable. No additional management support is needed unless otherwise documented below in the visit note. 

## 2016-12-28 NOTE — Patient Instructions (Addendum)
Skin irritation and itching, due to excessive abdominal skin folds.  Pruritus Pruritus is an itching feeling. There are many different conditions and factors that can make your skin itchy. Dry skin is one of the most common causes of itching. Most cases of itching do not require medical attention. Itchy skin can turn into a rash. Follow these instructions at home: Watch your pruritus for any changes. Take these steps to help with your condition: Skin Care   Moisturize your skin as needed. A moisturizer that contains petroleum jelly is best for keeping moisture in your skin.  Take or apply medicines only as directed by your health care provider. This may include:  Corticosteroid cream.  Anti-itch lotions.  Oral anti-histamines.  Apply cool compresses to the affected areas.  Try taking a bath with:  Epsom salts. Follow the instructions on the packaging. You can get these at your local pharmacy or grocery store.  Baking soda. Pour a small amount into the bath as directed by your health care provider.  Colloidal oatmeal. Follow the instructions on the packaging. You can get this at your local pharmacy or grocery store.  Try applying baking soda paste to your skin. Stir water into baking soda until it reaches a paste-like consistency.  Do not scratch your skin.  Avoid hot showers or baths, which can make itching worse. A cold shower may help with itching as long as you use a moisturizer after.  Avoid scented soaps, detergents, and perfumes. Use gentle soaps, detergents, perfumes, and other cosmetic products. General instructions   Avoid wearing tight clothes.  Keep a journal to help track what causes your itch. Write down:  What you eat.  What cosmetic products you use.  What you drink.  What you wear. This includes jewelry.  Use a humidifier. This keeps the air moist, which helps to prevent dry skin. Contact a health care provider if:  The itching does not go away  after several days.  You sweat at night.  You have weight loss.  You are unusually thirsty.  You urinate more than normal.  You are more tired than normal.  You have abdominal pain.  Your skin tingles.  You feel weak.  Your skin or the whites of your eyes look yellow (jaundice).  Your skin feels numb. This information is not intended to replace advice given to you by your health care provider. Make sure you discuss any questions you have with your health care provider. Document Released: 06/30/2011 Document Revised: 03/25/2016 Document Reviewed: 10/14/2014 Elsevier Interactive Patient Education  2017 [employer]

## 2017-01-04 ENCOUNTER — Encounter: Payer: Self-pay | Admitting: Nurse Practitioner

## 2017-01-04 DIAGNOSIS — I824Z1 Acute embolism and thrombosis of unspecified deep veins of right distal lower extremity: Secondary | ICD-10-CM

## 2017-01-04 MED ORDER — RIVAROXABAN 20 MG PO TABS: 20.0000 mg | ORAL_TABLET | Freq: Every day | ORAL | 0 refills | Status: DC

## 2017-01-10 ENCOUNTER — Encounter: Payer: Self-pay | Admitting: Nurse Practitioner

## 2017-02-02 ENCOUNTER — Encounter: Payer: Self-pay | Admitting: Nurse Practitioner

## 2017-02-02 DIAGNOSIS — I824Z1 Acute embolism and thrombosis of unspecified deep veins of right distal lower extremity: Secondary | ICD-10-CM

## 2017-02-03 DIAGNOSIS — Z09 Encounter for follow-up examination after completed treatment for conditions other than malignant neoplasm: Secondary | ICD-10-CM | POA: Diagnosis not present

## 2017-02-03 DIAGNOSIS — R1084 Generalized abdominal pain: Secondary | ICD-10-CM | POA: Diagnosis not present

## 2017-02-03 DIAGNOSIS — R109 Unspecified abdominal pain: Secondary | ICD-10-CM | POA: Diagnosis not present

## 2017-02-03 DIAGNOSIS — Z9884 Bariatric surgery status: Secondary | ICD-10-CM | POA: Diagnosis not present

## 2017-02-03 MED ORDER — RIVAROXABAN 20 MG PO TABS: 20.0000 mg | ORAL_TABLET | Freq: Every day | ORAL | 0 refills | Status: DC

## 2017-02-10 ENCOUNTER — Ambulatory Visit (HOSPITAL_COMMUNITY)
Admission: RE | Admit: 2017-02-10 | Discharge: 2017-02-10 | Disposition: A | Discharge status: Home / Self Care | Payer: 59 | Source: Ambulatory Visit | Attending: Cardiovascular Disease | Admitting: Cardiovascular Disease

## 2017-02-10 DIAGNOSIS — Z7901 Long term (current) use of anticoagulants: Secondary | ICD-10-CM | POA: Diagnosis not present

## 2017-02-10 DIAGNOSIS — I824Z1 Acute embolism and thrombosis of unspecified deep veins of right distal lower extremity: Secondary | ICD-10-CM | POA: Insufficient documentation

## 2017-02-10 DIAGNOSIS — I82421 Acute embolism and thrombosis of right iliac vein: Secondary | ICD-10-CM | POA: Diagnosis not present

## 2017-02-17 ENCOUNTER — Other Ambulatory Visit (HOSPITAL_COMMUNITY)
Admission: RE | Admit: 2017-02-17 | Discharge: 2017-02-17 | Disposition: A | Discharge status: Home / Self Care | Payer: 59 | Source: Ambulatory Visit | Attending: Nurse Practitioner | Admitting: Nurse Practitioner

## 2017-02-17 ENCOUNTER — Ambulatory Visit (INDEPENDENT_AMBULATORY_CARE_PROVIDER_SITE_OTHER): Payer: 59 | Admitting: Nurse Practitioner

## 2017-02-17 ENCOUNTER — Encounter: Payer: Self-pay | Admitting: Nurse Practitioner

## 2017-02-17 ENCOUNTER — Telehealth: Payer: Self-pay | Admitting: Nurse Practitioner

## 2017-02-17 VITALS — BP 116/74 | HR 56 | Temp 97.6°F | Ht 62.0 in | Wt 167.0 lb

## 2017-02-17 DIAGNOSIS — Z124 Encounter for screening for malignant neoplasm of cervix: Secondary | ICD-10-CM

## 2017-02-17 DIAGNOSIS — Z0000 Encounter for general adult medical examination without abnormal findings: Secondary | ICD-10-CM

## 2017-02-17 DIAGNOSIS — Z136 Encounter for screening for cardiovascular disorders: Secondary | ICD-10-CM

## 2017-02-17 DIAGNOSIS — I824Z1 Acute embolism and thrombosis of unspecified deep veins of right distal lower extremity: Secondary | ICD-10-CM

## 2017-02-17 DIAGNOSIS — L987 Excessive and redundant skin and subcutaneous tissue: Secondary | ICD-10-CM | POA: Insufficient documentation

## 2017-02-17 DIAGNOSIS — Z13220 Encounter for screening for lipoid disorders: Secondary | ICD-10-CM

## 2017-02-17 DIAGNOSIS — Z9884 Bariatric surgery status: Secondary | ICD-10-CM | POA: Insufficient documentation

## 2017-02-17 DIAGNOSIS — R252 Cramp and spasm: Secondary | ICD-10-CM | POA: Diagnosis not present

## 2017-02-17 DIAGNOSIS — Z1231 Encounter for screening mammogram for malignant neoplasm of breast: Secondary | ICD-10-CM

## 2017-02-17 MED ORDER — ASPIRIN EC 81 MG PO TBEC: 81.0000 mg | DELAYED_RELEASE_TABLET | Freq: Every day | ORAL | Status: DC

## 2017-02-17 NOTE — Patient Instructions (Signed)
Return to lab fasting at least 6-8hrs prior to blood draw.  Health Maintenance, Female Adopting a healthy lifestyle and getting preventive care can go a long way to promote health and wellness. Talk with your health care provider about what schedule of regular examinations is right for you. This is a good chance for you to check in with your provider about disease prevention and staying healthy. In between checkups, there are plenty of things you can do on your own. Experts have done a lot of research about which lifestyle changes and preventive measures are most likely to keep you healthy. Ask your health care provider for more information. Weight and diet Eat a healthy diet  Be sure to include plenty of vegetables, fruits, low-fat dairy products, and lean protein.  Do not eat a lot of foods high in solid fats, added sugars, or salt.  Get regular exercise. This is one of the most important things you can do for your health.  Most adults should exercise for at least 150 minutes each week. The exercise should increase your heart rate and make you sweat (moderate-intensity exercise).  Most adults should also do strengthening exercises at least twice a week. This is in addition to the moderate-intensity exercise. Maintain a healthy weight  Body mass index (BMI) is a measurement that can be used to identify possible weight problems. It estimates body fat based on height and weight. Your health care provider can help determine your BMI and help you achieve or maintain a healthy weight.  For females 52 years of age and older:  A BMI below 18.5 is considered underweight.  A BMI of 18.5 to 24.9 is normal.  A BMI of 25 to 29.9 is considered overweight.  A BMI of 30 and above is considered obese. Watch levels of cholesterol and blood lipids  You should start having your blood tested for lipids and cholesterol at 48 years of age, then have this test every 5 years.  You may need to have your  cholesterol levels checked more often if:  Your lipid or cholesterol levels are high.  You are older than 48 years of age.  You are at high risk for heart disease. Cancer screening Lung Cancer  Lung cancer screening is recommended for adults 86-44 years old who are at high risk for lung cancer because of a history of smoking.  A yearly low-dose CT scan of the lungs is recommended for people who:  Currently smoke.  Have quit within the past 15 years.  Have at least a 30-pack-year history of smoking. A pack year is smoking an average of one pack of cigarettes a day for 1 year.  Yearly screening should continue until it has been 15 years since you quit.  Yearly screening should stop if you develop a health problem that would prevent you from having lung cancer treatment. Breast Cancer  Practice breast self-awareness. This means understanding how your breasts normally appear and feel.  It also means doing regular breast self-exams. Let your health care provider know about any changes, no matter how small.  If you are in your 20s or 30s, you should have a clinical breast exam (CBE) by a health care provider every 1-3 years as part of a regular health exam.  If you are 17 or older, have a CBE every year. Also consider having a breast X-ray (mammogram) every year.  If you have a family history of breast cancer, talk to your health care provider about genetic  screening.  If you are at high risk for breast cancer, talk to your health care provider about having an MRI and a mammogram every year.  Breast cancer gene (BRCA) assessment is recommended for women who have family members with BRCA-related cancers. BRCA-related cancers include:  Breast.  Ovarian.  Tubal.  Peritoneal cancers.  Results of the assessment will determine the need for genetic counseling and BRCA1 and BRCA2 testing. Cervical Cancer  Your health care provider may recommend that you be screened regularly for  cancer of the pelvic organs (ovaries, uterus, and vagina). This screening involves a pelvic examination, including checking for microscopic changes to the surface of your cervix (Pap test). You may be encouraged to have this screening done every 3 years, beginning at age 34.  For women ages 27-65, health care providers may recommend pelvic exams and Pap testing every 3 years, or they may recommend the Pap and pelvic exam, combined with testing for human papilloma virus (HPV), every 5 years. Some types of HPV increase your risk of cervical cancer. Testing for HPV may also be done on women of any age with unclear Pap test results.  Other health care providers may not recommend any screening for nonpregnant women who are considered low risk for pelvic cancer and who do not have symptoms. Ask your health care provider if a screening pelvic exam is right for you.  If you have had past treatment for cervical cancer or a condition that could lead to cancer, you need Pap tests and screening for cancer for at least 20 years after your treatment. If Pap tests have been discontinued, your risk factors (such as having a new sexual partner) need to be reassessed to determine if screening should resume. Some women have medical problems that increase the chance of getting cervical cancer. In these cases, your health care provider may recommend more frequent screening and Pap tests. Colorectal Cancer  This type of cancer can be detected and often prevented.  Routine colorectal cancer screening usually begins at 48 years of age and continues through 48 years of age.  Your health care provider may recommend screening at an earlier age if you have risk factors for colon cancer.  Your health care provider may also recommend using home test kits to check for hidden blood in the stool.  A small camera at the end of a tube can be used to examine your colon directly (sigmoidoscopy or colonoscopy). This is done to check for  the earliest forms of colorectal cancer.  Routine screening usually begins at age 102.  Direct examination of the colon should be repeated every 5-10 years through 48 years of age. However, you may need to be screened more often if early forms of precancerous polyps or small growths are found. Skin Cancer  Check your skin from head to toe regularly.  Tell your health care provider about any new moles or changes in moles, especially if there is a change in a mole's shape or color.  Also tell your health care provider if you have a mole that is larger than the size of a pencil eraser.  Always use sunscreen. Apply sunscreen liberally and repeatedly throughout the day.  Protect yourself by wearing long sleeves, pants, a wide-brimmed hat, and sunglasses whenever you are outside. Heart disease, diabetes, and high blood pressure  High blood pressure causes heart disease and increases the risk of stroke. High blood pressure is more likely to develop in:  People who have blood pressure  in the high end of the normal range (130-139/85-89 mm Hg).  People who are overweight or obese.  People who are African American.  If you are 79-60 years of age, have your blood pressure checked every 3-5 years. If you are 20 years of age or older, have your blood pressure checked every year. You should have your blood pressure measured twice-once when you are at a hospital or clinic, and once when you are not at a hospital or clinic. Record the average of the two measurements. To check your blood pressure when you are not at a hospital or clinic, you can use:  An automated blood pressure machine at a pharmacy.  A home blood pressure monitor.  If you are between 30 years and 44 years old, ask your health care provider if you should take aspirin to prevent strokes.  Have regular diabetes screenings. This involves taking a blood sample to check your fasting blood sugar level.  If you are at a normal weight and  have a low risk for diabetes, have this test once every three years after 48 years of age.  If you are overweight and have a high risk for diabetes, consider being tested at a younger age or more often. Preventing infection Hepatitis B  If you have a higher risk for hepatitis B, you should be screened for this virus. You are considered at high risk for hepatitis B if:  You were born in a country where hepatitis B is common. Ask your health care provider which countries are considered high risk.  Your parents were born in a high-risk country, and you have not been immunized against hepatitis B (hepatitis B vaccine).  You have HIV or AIDS.  You use needles to inject street drugs.  You live with someone who has hepatitis B.  You have had sex with someone who has hepatitis B.  You get hemodialysis treatment.  You take certain medicines for conditions, including cancer, organ transplantation, and autoimmune conditions. Hepatitis C  Blood testing is recommended for:  Everyone born from [birth year] through 1967.  Anyone with known risk factors for hepatitis C. Sexually transmitted infections (STIs)  You should be screened for sexually transmitted infections (STIs) including gonorrhea and chlamydia if:  You are sexually active and are younger than 48 years of age.  You are older than 48 years of age and your health care provider tells you that you are at risk for this type of infection.  Your sexual activity has changed since you were last screened and you are at an increased risk for chlamydia or gonorrhea. Ask your health care provider if you are at risk.  If you do not have HIV, but are at risk, it may be recommended that you take a prescription medicine daily to prevent HIV infection. This is called pre-exposure prophylaxis (PrEP). You are considered at risk if:  You are sexually active and do not regularly use condoms or know the HIV status of your partner(s).  You take drugs by  injection.  You are sexually active with a partner who has HIV. Talk with your health care provider about whether you are at high risk of being infected with HIV. If you choose to begin PrEP, you should first be tested for HIV. You should then be tested every 3 months for as long as you are taking PrEP. Pregnancy  If you are premenopausal and you may become pregnant, ask your health care provider about preconception counseling.  If you  may become pregnant, take 400 to 800 micrograms (mcg) of folic acid every day.  If you want to prevent pregnancy, talk to your health care provider about birth control (contraception). Osteoporosis and menopause  Osteoporosis is a disease in which the bones lose minerals and strength with aging. This can result in serious bone fractures. Your risk for osteoporosis can be identified using a bone density scan.  If you are 68 years of age or older, or if you are at risk for osteoporosis and fractures, ask your health care provider if you should be screened.  Ask your health care provider whether you should take a calcium or vitamin D supplement to lower your risk for osteoporosis.  Menopause may have certain physical symptoms and risks.  Hormone replacement therapy may reduce some of these symptoms and risks. Talk to your health care provider about whether hormone replacement therapy is right for you. Follow these instructions at home:  Schedule regular health, dental, and eye exams.  Stay current with your immunizations.  Do not use any tobacco products including cigarettes, chewing tobacco, or electronic cigarettes.  If you are pregnant, do not drink alcohol.  If you are breastfeeding, limit how much and how often you drink alcohol.  Limit alcohol intake to no more than 1 drink per day for nonpregnant women. One drink equals 12 ounces of beer, 5 ounces of wine, or 1 ounces of hard liquor.  Do not use street drugs.  Do not share needles.  Ask  your health care provider for help if you need support or information about quitting drugs.  Tell your health care provider if you often feel depressed.  Tell your health care provider if you have ever been abused or do not feel safe at home. This information is not intended to replace advice given to you by your health care provider. Make sure you discuss any questions you have with your health care provider. Document Released: 05/03/2011 Document Revised: 03/25/2016 Document Reviewed: 07/22/2015 Elsevier Interactive Patient Education  2017 [employer].

## 2017-02-17 NOTE — Telephone Encounter (Signed)
Pt has future PAP and HPV and it needs to be changed to Active.

## 2017-02-17 NOTE — Progress Notes (Signed)
Pre visit review using our clinic review tool, if applicable. No additional management support is needed unless otherwise documented below in the visit note. 

## 2017-02-17 NOTE — Assessment & Plan Note (Signed)
Resolved DVT. xarelto taken for 6months. Stop xarelto today

## 2017-02-17 NOTE — Telephone Encounter (Signed)
Please help.

## 2017-02-17 NOTE — Progress Notes (Signed)
Subjective:    Patient ID: Elizabeth Thornton, female    DOB: 27-Jan-1971, 46 y.o.   MRN: [id number]  Patient presents today for complete physical   HPI  DVT: completed 6months of xarelto therapy. Venous doppler negative for DVT 02/10/2017.   LUQ ABD Pain(chronic): Onset after gastric bypass 07/2014 Intermittent, getting more frequent described as spasm Relief with stretching. CT ABD/Pelvis done by [facility] 01/2017 (normal). Denies any abd pain in office today.  She is planning to have excess skin reduction by [facility]. Date not set yet.  Immunizations: (TDAP, Hep C screen, Pneumovax, Influenza, zoster)  Health Maintenance  Topic Date Due  . Pap Smear  06/26/1990  . HIV Screening  01/30/2018*  . Flu Shot  06/01/2017  . Tetanus Vaccine  08/26/2026  *Topic was postponed. The date shown is not the original due date.   Diet:heart healthy Weight:  Wt Readings from Last 3 Encounters:  02/17/17 167 lb (75.8 kg)  12/28/16 176 lb (79.8 kg)  09/28/16 175 lb (79.4 kg)   Exercise:walking, cycling and weights.  Home Safety:home with husband Depression/Suicide: Depression screen [facility] 2/9 02/17/2017  Decreased Interest 0  Down, Depressed, Hopeless 0  PHQ - 2 Score 0   No flowsheet data found. Pap Smear (every 56yrs for >21-29 without HPV, every 18yrs for >30-56yrs with HPV):needed Mammogram (yearly, >12yrs):needed Advanced Directive: Advanced Directives 08/03/2016  Does Patient Have a Medical Advance Directive? No  Would patient like information on creating a medical advance directive? No - patient declined information   Sexual History (birth control, marital status, STD):sexually active, IUD present  Medications and allergies reviewed with patient and updated if appropriate.  Patient Active Problem List   Diagnosis Date Noted  . H/O gastric bypass 02/17/2017  . Excess skin of abdominal wall 02/17/2017  . Muscle cramps at night 02/17/2017  . DVT (deep venous  thrombosis) (HCC) 08/26/2016  . Fibromyalgia 08/26/2016  . Osteoarthritis of knees, bilateral 08/26/2016    Current Outpatient Prescriptions on File Prior to Visit  Medication Sig Dispense Refill  . albuterol (VENTOLIN HFA) 108 (90 BASE) MCG/ACT inhaler Frequency:PRN   Dosage:90   MCG  Instructions:  Note:Dose: 90 MCG    . beclomethasone (QVAR) 40 MCG/ACT inhaler Inhale into the lungs as needed.    . calcium citrate-vitamin D (CITRACAL+D) 315-200 MG-UNIT per tablet Take by mouth.    . Cholecalciferol (VITAMIN D-1000 MAX ST) 1000 units tablet Take 1,000 Units by mouth 1 day or 1 dose.    Marland Kitchen Corn Starch-Zinc Oxide 81-15 % POWD Apply 1 application topically at bedtime. 142 g 0  . Cyanocobalamin (VITAMIN B-12 CR) 1000 MCG TBCR Take 2,500 mcg by mouth 2 (two) times daily.     Marland Kitchen gabapentin (NEURONTIN) 300 MG capsule Take 1 capsule by mouth at bedtime.    . multivitamin-iron-minerals-folic acid (CENTRUM) chewable tablet Frequency:BID   Dosage:0.0     Instructions:  Note:Dose: 1    . omeprazole (PRILOSEC) 20 MG capsule Take 20 mg by mouth 3 times/day as needed-between meals & bedtime.     . Sodium Hyaluronate (EUFLEXXA) 20 MG/2ML SOSY Inject 10 mg as directed.    . topiramate (TOPAMAX) 50 MG tablet Take 50 mg by mouth 1 day or 1 dose.     No current facility-administered medications on file prior to visit.     Past Medical History:  Diagnosis Date  . Allergy   . Arthritis   . Asthma   . GERD (gastroesophageal reflux  disease)   . Hypertension     Past Surgical History:  Procedure Laterality Date  . gastric bipass     done 2013  . MENISCUS REPAIR Bilateral    2006 and 2008  . roux n y  06/26/2012   gastric by pass    Social History   Social History  . Marital status: Single    Spouse name: N/A  . Number of children: N/A  . Years of education: N/A   Social History Main Topics  . Smoking status: Never Smoker  . Smokeless tobacco: Never Used  . Alcohol use No  . Drug use: No  .  Sexual activity: Yes    Birth control/ protection: IUD     Comment: mirena   Other Topics Concern  . None   Social History Narrative  . None    Family History  Problem Relation Age of Onset  . Cancer Mother   . Arthritis Mother   . Hypertension Mother   . Hyperlipidemia Mother   . Diabetes Mother   . Antithrombin III deficiency Mother   . Hypertension Father   . Hyperlipidemia Father   . Diabetes Father   . Cancer Maternal Aunt   . Cancer Maternal Uncle   . Arthritis Maternal Grandmother   . Arthritis Maternal Grandfather   . Arthritis Paternal Grandmother   . Arthritis Paternal Grandfather         Review of Systems  Constitutional: Negative for fever, malaise/fatigue and weight loss.  HENT: Negative for congestion and sore throat.   Eyes:       Negative for visual changes  Respiratory: Negative for cough and shortness of breath.   Cardiovascular: Negative for chest pain, palpitations and leg swelling.  Gastrointestinal: Positive for abdominal pain. Negative for blood in stool, constipation, diarrhea, heartburn, melena, nausea and vomiting.  Genitourinary: Negative for dysuria, frequency and urgency.  Musculoskeletal: Negative for falls, joint pain and myalgias.  Skin: Negative for rash.  Neurological: Negative for dizziness, sensory change and headaches.  Endo/Heme/Allergies: Does not bruise/bleed easily.  Psychiatric/Behavioral: Negative for depression, substance abuse and suicidal ideas. The patient is not nervous/anxious.     Objective:   Vitals:   02/17/17 0812  BP: 116/74  Pulse: (!) 56  Temp: 97.6 F (36.4 C)    Body mass index is 30.54 kg/m.   Physical Examination:  Physical Exam  Constitutional: She is oriented to person, place, and time and well-developed, well-nourished, and in no distress. No distress.  HENT:  Right Ear: External ear normal.  Left Ear: External ear normal.  Nose: Nose normal.  Mouth/Throat: No oropharyngeal exudate.    Eyes: Conjunctivae and EOM are normal. Pupils are equal, round, and reactive to light. No scleral icterus.  Neck: Normal range of motion. Neck supple. No thyromegaly present.  Cardiovascular: Normal rate, regular rhythm, normal heart sounds and intact distal pulses.   Pulmonary/Chest: Effort normal and breath sounds normal. Right breast exhibits no inverted nipple, no mass, no nipple discharge, no skin change and no tenderness. Left breast exhibits no inverted nipple, no mass, no nipple discharge, no skin change and no tenderness. Breasts are symmetrical.  Abdominal: Soft. Bowel sounds are normal. She exhibits no distension. There is no tenderness.  Genitourinary: Rectum normal, vagina normal, cervix normal, right adnexa normal, left adnexa normal and vulva normal. Cervix exhibits no motion tenderness. No vaginal discharge found.  Musculoskeletal: Normal range of motion. She exhibits no edema or tenderness.  Lymphadenopathy:    She  has no cervical adenopathy.  Neurological: She is alert and oriented to person, place, and time. Gait normal.  Skin: Skin is warm and dry.  Psychiatric: Affect and judgment normal.  Vitals reviewed.   ASSESSMENT and PLAN:  Syvanna was seen today for annual exam.  Diagnoses and all orders for this visit:  Encounter for preventative adult health care examination -     Lipid panel; Future -     Comprehensive metabolic panel; Future -     CBC w/Diff; Future -     TSH; Future  Encounter for lipid screening for cardiovascular disease -     Lipid panel; Future  Acute deep vein thrombosis (DVT) of distal vein of right lower extremity (HCC) -     aspirin EC 81 MG tablet; Take 1 tablet (81 mg total) by mouth daily.  Muscle cramps at night -     Magnesium; Future  Encounter for Papanicolaou smear for cervical cancer screening -     Cytology - PAP; Future    DVT (deep venous thrombosis) (HCC) Resolved DVT. xarelto taken for 6months. Stop xarelto  today      Follow up: No Follow-up on file.  Alysia Penna, NP

## 2017-02-18 ENCOUNTER — Other Ambulatory Visit: Payer: 59

## 2017-02-18 DIAGNOSIS — E881 Lipodystrophy, not elsewhere classified: Secondary | ICD-10-CM | POA: Insufficient documentation

## 2017-02-18 DIAGNOSIS — Z124 Encounter for screening for malignant neoplasm of cervix: Secondary | ICD-10-CM

## 2017-02-18 LAB — CYTOLOGY - PAP
Adequacy: ABSENT
Diagnosis: NEGATIVE
HPV: NOT DETECTED

## 2017-02-20 NOTE — Addendum Note (Signed)
Addended by: Alysia Penna L on: 02/20/2017 07:05 PM   Modules accepted: Orders

## 2017-02-20 NOTE — Telephone Encounter (Signed)
I apologize fr the delay. Ordered has been amended. Thank you

## 2017-02-23 ENCOUNTER — Other Ambulatory Visit (INDEPENDENT_AMBULATORY_CARE_PROVIDER_SITE_OTHER): Payer: 59

## 2017-02-23 DIAGNOSIS — Z13220 Encounter for screening for lipoid disorders: Secondary | ICD-10-CM | POA: Diagnosis not present

## 2017-02-23 DIAGNOSIS — R252 Cramp and spasm: Secondary | ICD-10-CM

## 2017-02-23 DIAGNOSIS — Z0000 Encounter for general adult medical examination without abnormal findings: Secondary | ICD-10-CM

## 2017-02-23 DIAGNOSIS — Z136 Encounter for screening for cardiovascular disorders: Secondary | ICD-10-CM

## 2017-02-23 LAB — CBC WITH DIFFERENTIAL/PLATELET
Basophils Absolute: 0 10*3/uL (ref 0.0–0.1)
Basophils Relative: 0.8 % (ref 0.0–3.0)
EOS ABS: 0.1 10*3/uL (ref 0.0–0.7)
Eosinophils Relative: 1.9 % (ref 0.0–5.0)
HEMATOCRIT: 36.8 % (ref 36.0–46.0)
HEMOGLOBIN: 12.2 g/dL (ref 12.0–15.0)
LYMPHS PCT: 34.1 % (ref 12.0–46.0)
Lymphs Abs: 1.9 10*3/uL (ref 0.7–4.0)
MCHC: 33.1 g/dL (ref 30.0–36.0)
MCV: 85.3 fl (ref 78.0–100.0)
Monocytes Absolute: 0.6 10*3/uL (ref 0.1–1.0)
Monocytes Relative: 11.1 % (ref 3.0–12.0)
Neutro Abs: 2.9 10*3/uL (ref 1.4–7.7)
Neutrophils Relative %: 52.1 % (ref 43.0–77.0)
Platelets: 288 10*3/uL (ref 150.0–400.0)
RBC: 4.31 Mil/uL (ref 3.87–5.11)
RDW: 16.6 % — AB (ref 11.5–15.5)
WBC: 5.5 10*3/uL (ref 4.0–10.5)

## 2017-02-23 LAB — LIPID PANEL
CHOLESTEROL: 166 mg/dL (ref 0–200)
HDL: 52.7 mg/dL (ref >39.00)
LDL CALC: 102 mg/dL — AB (ref 0–99)
NonHDL: 113.55
TRIGLYCERIDES: 58 mg/dL (ref 0.0–149.0)
Total CHOL/HDL Ratio: 3
VLDL: 11.6 mg/dL (ref 0.0–40.0)

## 2017-02-23 LAB — COMPREHENSIVE METABOLIC PANEL
ALBUMIN: 3.8 g/dL (ref 3.5–5.2)
ALT: 8 U/L (ref 0–35)
AST: 12 U/L (ref 0–37)
Alkaline Phosphatase: 31 U/L — ABNORMAL LOW (ref 39–117)
BUN: 19 mg/dL (ref 6–23)
CALCIUM: 9.1 mg/dL (ref 8.4–10.5)
CHLORIDE: 110 meq/L (ref 96–112)
CO2: 25 mEq/L (ref 19–32)
CREATININE: 0.74 mg/dL (ref 0.40–1.20)
GFR: 89.16 mL/min (ref >60.00)
Glucose, Bld: 85 mg/dL (ref 70–99)
POTASSIUM: 4.2 meq/L (ref 3.5–5.1)
Sodium: 140 mEq/L (ref 135–145)
Total Bilirubin: 0.5 mg/dL (ref 0.2–1.2)
Total Protein: 6.7 g/dL (ref 6.0–8.3)

## 2017-02-23 LAB — MAGNESIUM: MAGNESIUM: 2.1 mg/dL (ref 1.5–2.5)

## 2017-02-23 LAB — TSH: TSH: 2.95 u[IU]/mL (ref 0.35–4.50)

## 2017-07-08 DIAGNOSIS — L574 Cutis laxa senilis: Secondary | ICD-10-CM | POA: Diagnosis not present

## 2017-07-08 DIAGNOSIS — Z9884 Bariatric surgery status: Secondary | ICD-10-CM | POA: Diagnosis not present

## 2017-07-08 DIAGNOSIS — M1990 Unspecified osteoarthritis, unspecified site: Secondary | ICD-10-CM | POA: Diagnosis not present

## 2017-08-08 ENCOUNTER — Ambulatory Visit (INDEPENDENT_AMBULATORY_CARE_PROVIDER_SITE_OTHER): Payer: 59 | Admitting: Nurse Practitioner

## 2017-08-08 ENCOUNTER — Encounter: Payer: Self-pay | Admitting: Nurse Practitioner

## 2017-08-08 VITALS — BP 120/70 | HR 47 | Temp 97.7°F | Ht 62.0 in | Wt 167.0 lb

## 2017-08-08 DIAGNOSIS — M797 Fibromyalgia: Secondary | ICD-10-CM | POA: Diagnosis not present

## 2017-08-08 DIAGNOSIS — Z23 Encounter for immunization: Secondary | ICD-10-CM | POA: Diagnosis not present

## 2017-08-08 MED ORDER — GABAPENTIN 300 MG PO CAPS: 300.0000 mg | ORAL_CAPSULE | Freq: Every day | ORAL | 3 refills | Status: DC

## 2017-08-08 NOTE — Patient Instructions (Addendum)
[facility] [address]

## 2017-08-08 NOTE — Progress Notes (Signed)
Subjective:  Patient ID: Elizabeth Thornton, female    DOB: September 15, 1971  Age: 46 y.o. MRN: [id number]  CC: Medication Refill (gabapentin refill. just had lab work done through [facility]--recently. changed diet)   HPI  Fibromyalgia: Stable with use of gabapentin at HS. Denies any adverse effects with medication.  Reviewed labs done by [facility] via [facility]: CBC,  CMP, IBC, vit B12 and Folate, lipid panel, Vit D, and TSH.  Outpatient Medications Prior to Visit  Medication Sig Dispense Refill  . albuterol (VENTOLIN HFA) 108 (90 BASE) MCG/ACT inhaler Frequency:PRN   Dosage:90   MCG  Instructions:  Note:Dose: 90 MCG    . aspirin EC 81 MG tablet Take 1 tablet (81 mg total) by mouth daily. 30 tablet   . beclomethasone (QVAR) 40 MCG/ACT inhaler Inhale into the lungs as needed.    . calcium citrate-vitamin D (CITRACAL+D) 315-200 MG-UNIT per tablet Take by mouth.    . Cholecalciferol (VITAMIN D-1000 MAX ST) 1000 units tablet Take 1,000 Units by mouth 1 day or 1 dose.    . Cyanocobalamin (VITAMIN B-12 CR) 1000 MCG TBCR Take 2,500 mcg by mouth 2 (two) times daily.     . multivitamin-iron-minerals-folic acid (CENTRUM) chewable tablet Frequency:BID   Dosage:0.0     Instructions:  Note:Dose: 1    . omeprazole (PRILOSEC) 20 MG capsule Take 20 mg by mouth 3 times/day as needed-between meals & bedtime.     . Sodium Hyaluronate (EUFLEXXA) 20 MG/2ML SOSY Inject 10 mg as directed.    . topiramate (TOPAMAX) 50 MG tablet Take 50 mg by mouth 1 day or 1 dose.    . gabapentin (NEURONTIN) 300 MG capsule Take 1 capsule by mouth at bedtime.    Marland Kitchen Corn Starch-Zinc Oxide 81-15 % POWD Apply 1 application topically at bedtime. (Patient not taking: Reported on 08/08/2017) 142 g 0   No facility-administered medications prior to visit.     ROS Review of Systems  Constitutional: Negative for malaise/fatigue.  Respiratory: Negative for cough and shortness of breath.   Cardiovascular: Negative for palpitations and leg  swelling.  Gastrointestinal: Negative for abdominal pain, constipation, diarrhea and nausea.  Musculoskeletal: Negative for falls.  Skin: Negative.   Neurological: Negative for dizziness.     Objective:  BP 120/70   Pulse (!) 47   Temp 97.7 F (36.5 C)   Ht  (1.575 m)   Wt 167 lb (75.8 kg)   SpO2 100%   BMI 30.54 kg/m   BP Readings from Last 3 Encounters:  08/08/17 120/70  02/17/17 116/74  12/28/16 114/70    Wt Readings from Last 3 Encounters:  08/08/17 167 lb (75.8 kg)  02/17/17 167 lb (75.8 kg)  12/28/16 176 lb (79.8 kg)    Physical Exam  Constitutional: She is oriented to person, place, and time. No distress.  Cardiovascular: Normal rate, regular rhythm and normal heart sounds.   Pulmonary/Chest: Effort normal and breath sounds normal.  Musculoskeletal: She exhibits no edema.  Neurological: She is alert and oriented to person, place, and time.  Psychiatric: She has a normal mood and affect. Her behavior is normal.  Vitals reviewed.   Lab Results  Component Value Date   WBC 5.5 02/23/2017   HGB 12.2 02/23/2017   HCT 36.8 02/23/2017   PLT 288.0 02/23/2017   GLUCOSE 85 02/23/2017   CHOL 166 02/23/2017   TRIG 58.0 02/23/2017   HDL 52.70 02/23/2017   LDLCALC 102 (H) 02/23/2017   ALT 8 02/23/2017  AST 12 02/23/2017   NA 140 02/23/2017   K 4.2 02/23/2017   CL 110 02/23/2017   CREATININE 0.74 02/23/2017   BUN 19 02/23/2017   CO2 25 02/23/2017   TSH 2.95 02/23/2017   INR 1.06 08/03/2016    Assessment & Plan:   Elizabeth Thornton was seen today for medication refill.  Diagnoses and all orders for this visit:  Fibromyalgia -     gabapentin (NEURONTIN) 300 MG capsule; Take 1 capsule (300 mg total) by mouth at bedtime.  Need for influenza vaccination -     Flu vaccine, recombinant, quadrivalent, inj (Flublok quad egg free)  Other orders -     Cancel: Flu vaccine, recombinat, quadrivalent, inj   I have changed Elizabeth Thornton gabapentin. I am also having  her maintain her albuterol, calcium citrate-vitamin D, Vitamin B-12 CR, multivitamin-iron-minerals-folic acid, omeprazole, beclomethasone, topiramate, Cholecalciferol, Sodium Hyaluronate, Corn Starch-Zinc Oxide, and aspirin EC.  Meds ordered this encounter  Medications  . gabapentin (NEURONTIN) 300 MG capsule    Sig: Take 1 capsule (300 mg total) by mouth at bedtime.    Dispense:  90 capsule    Refill:  3    Order Specific Question:   Supervising Provider    Answer:   Tresa Garter [[id number]]    Follow-up: Return in about 6 months (around 02/16/2018) for CPE (fasting) at [facility].  Alysia Penna, NP

## 2017-08-18 ENCOUNTER — Encounter: Payer: Self-pay | Admitting: Nurse Practitioner

## 2017-11-10 ENCOUNTER — Ambulatory Visit: Payer: 59

## 2017-12-01 ENCOUNTER — Encounter: Payer: Self-pay | Admitting: Nurse Practitioner

## 2017-12-01 ENCOUNTER — Ambulatory Visit: Payer: 59 | Admitting: Nurse Practitioner

## 2017-12-01 VITALS — BP 118/74 | HR 57 | Temp 97.8°F | Ht 62.0 in | Wt 175.0 lb

## 2017-12-01 DIAGNOSIS — M62830 Muscle spasm of back: Secondary | ICD-10-CM | POA: Diagnosis not present

## 2017-12-01 MED ORDER — METHOCARBAMOL 750 MG PO TABS: 750.0000 mg | ORAL_TABLET | Freq: Three times a day (TID) | ORAL | 0 refills | Status: DC | PRN

## 2017-12-01 MED ORDER — TRAMADOL HCL 50 MG PO TABS: 50.0000 mg | ORAL_TABLET | Freq: Two times a day (BID) | ORAL | 0 refills | Status: DC | PRN

## 2017-12-01 MED ORDER — KETOROLAC TROMETHAMINE 30 MG/ML IJ SOLN
30.0000 mg | Freq: Once | INTRAMUSCULAR | Status: AC
  Administered 2017-12-01: 30 mg via INTRAMUSCULAR

## 2017-12-01 NOTE — Patient Instructions (Signed)
Muscle Strain A muscle strain is an injury that occurs when a muscle is stretched beyond its normal length. Usually a small number of muscle fibers are torn when this happens. Muscle strain is rated in degrees. First-degree strains have the least amount of muscle fiber tearing and pain. Second-degree and third-degree strains have increasingly more tearing and pain. Usually, recovery from muscle strain takes 1-2 weeks. Complete healing takes 5-6 weeks. What are the causes? Muscle strain happens when a sudden, violent force placed on a muscle stretches it too far. This may occur with lifting, sports, or a fall. What increases the risk? Muscle strain is especially common in athletes. What are the signs or symptoms? At the site of the muscle strain, there may be:  Pain.  Bruising.  Swelling.  Difficulty using the muscle due to pain or lack of normal function.  How is this diagnosed? Your health care provider will perform a physical exam and ask about your medical history. How is this treated? Often, the best treatment for a muscle strain is resting, icing, and applying cold compresses to the injured area. Follow these instructions at home:  Use the PRICE method of treatment to promote muscle healing during the first 2-3 days after your injury. The PRICE method involves: ? Protecting the muscle from being injured again. ? Restricting your activity and resting the injured body part. ? Icing your injury. To do this, put ice in a plastic bag. Place a towel between your skin and the bag. Then, apply the ice and leave it on from 15-20 minutes each hour. After the third day, switch to moist heat packs. ? Apply compression to the injured area with a splint or elastic bandage. Be careful not to wrap it too tightly. This may interfere with blood circulation or increase swelling. ? Elevate the injured body part above the level of your heart as often as you can.  Only take over-the-counter or  prescription medicines for pain, discomfort, or fever as directed by your health care provider.  Warming up prior to exercise helps to prevent future muscle strains. Contact a health care provider if:  You have increasing pain or swelling in the injured area.  You have numbness, tingling, or a significant loss of strength in the injured area. This information is not intended to replace advice given to you by your health care provider. Make sure you discuss any questions you have with your health care provider. Document Released: 10/18/2005 Document Revised: 03/25/2016 Document Reviewed: 05/17/2013 Elsevier Interactive Patient Education  2017 Elsevier Inc.  

## 2017-12-01 NOTE — Progress Notes (Signed)
Subjective:  Patient ID: Elizabeth Thornton, female    DOB: December 11, 1970  Age: 47 y.o. MRN: [id number]  CC: Back Pain (back spasm/fall yesterday/cant really walk/right side hip lower when stand up. )  Back Pain  This is a new problem. The current episode started yesterday. The problem occurs constantly. The problem is unchanged. The pain is present in the lumbar spine. The quality of the pain is described as cramping and aching. The pain does not radiate. The pain is severe. The symptoms are aggravated by bending, lying down, standing and twisting. Stiffness is present all day. Associated symptoms include numbness. Pertinent negatives include no abdominal pain, bladder incontinence, bowel incontinence, dysuria, fever, headaches, leg pain, paresis, paresthesias, pelvic pain, perianal numbness, tingling, weakness or weight loss. (Numbness along bilateral lower lumbar paraspinal muscle.) Risk factors include recent trauma (slipped while mopping her floor at home). She has tried ice and home exercises (TENs unit) for the symptoms. The treatment provided no relief.  controlled substance database reviewed: no reg flags.  Outpatient Medications Prior to Visit  Medication Sig Dispense Refill  . albuterol (VENTOLIN HFA) 108 (90 BASE) MCG/ACT inhaler Frequency:PRN   Dosage:90   MCG  Instructions:  Note:Dose: 90 MCG    . aspirin EC 81 MG tablet Take 1 tablet (81 mg total) by mouth daily. 30 tablet   . beclomethasone (QVAR) 40 MCG/ACT inhaler Inhale into the lungs as needed.    . calcium citrate-vitamin D (CITRACAL+D) 315-200 MG-UNIT per tablet Take by mouth.    . Cholecalciferol (VITAMIN D-1000 MAX ST) 1000 units tablet Take 1,000 Units by mouth 1 day or 1 dose.    Marland Kitchen Corn Starch-Zinc Oxide 81-15 % POWD Apply 1 application topically at bedtime. 142 g 0  . Cyanocobalamin (VITAMIN B-12 CR) 1000 MCG TBCR Take 2,500 mcg by mouth 2 (two) times daily.     Marland Kitchen gabapentin (NEURONTIN) 300 MG capsule Take 1 capsule (300 mg  total) by mouth at bedtime. 90 capsule 3  . multivitamin-iron-minerals-folic acid (CENTRUM) chewable tablet Frequency:BID   Dosage:0.0     Instructions:  Note:Dose: 1    . omeprazole (PRILOSEC) 20 MG capsule Take 20 mg by mouth 3 times/day as needed-between meals & bedtime.     . Sodium Hyaluronate (EUFLEXXA) 20 MG/2ML SOSY Inject 10 mg as directed.    . topiramate (TOPAMAX) 50 MG tablet Take 50 mg by mouth 1 day or 1 dose.     No facility-administered medications prior to visit.     ROS See HPI  Objective:  BP 118/74   Pulse (!) 57   Temp 97.8 F (36.6 C)   Ht 5\' 2"  (1.575 m)   Wt 175 lb (79.4 kg)   SpO2 99%   BMI 32.01 kg/m   BP Readings from Last 3 Encounters:  12/01/17 118/74  08/08/17 120/70  02/17/17 116/74    Wt Readings from Last 3 Encounters:  12/01/17 175 lb (79.4 kg)  08/08/17 167 lb (75.8 kg)  02/17/17 167 lb (75.8 kg)    Physical Exam  Constitutional: She is oriented to person, place, and time. No distress.  Cardiovascular: Normal rate.  Pulmonary/Chest: Effort normal.  Musculoskeletal: She exhibits tenderness. She exhibits no edema or deformity.       Right hip: Normal.       Left hip: Normal.       Right knee: Normal.       Left knee: Normal.       Thoracic back: Normal.  Lumbar back: She exhibits tenderness, pain and spasm. She exhibits no bony tenderness, no swelling, no edema and normal pulse.       Back:       Right upper leg: Normal.       Left upper leg: Normal.  Neurological: She is alert and oriented to person, place, and time. She has normal reflexes.  Skin: No rash noted. No erythema.  Vitals reviewed.   Lab Results  Component Value Date   WBC 5.5 02/23/2017   HGB 12.2 02/23/2017   HCT 36.8 02/23/2017   PLT 288.0 02/23/2017   GLUCOSE 85 02/23/2017   CHOL 166 02/23/2017   TRIG 58.0 02/23/2017   HDL 52.70 02/23/2017   LDLCALC 102 (H) 02/23/2017   ALT 8 02/23/2017   AST 12 02/23/2017   NA 140 02/23/2017   K 4.2 02/23/2017    CL 110 02/23/2017   CREATININE 0.74 02/23/2017   BUN 19 02/23/2017   CO2 25 02/23/2017   TSH 2.95 02/23/2017   INR 1.06 08/03/2016    No results found.  Assessment & Plan:   Irvin was seen today for back pain.  Diagnoses and all orders for this visit:  Muscle spasm of back -     ketorolac (TORADOL) 30 MG/ML injection 30 mg -     methocarbamol (ROBAXIN) 750 MG tablet; Take 1 tablet (750 mg total) by mouth every 8 (eight) hours as needed for muscle spasms. -     traMADol (ULTRAM) 50 MG tablet; Take 1 tablet (50 mg total) by mouth every 12 (twelve) hours as needed.   I am having Delfina Amedee start on methocarbamol and traMADol. I am also having her maintain her albuterol, calcium citrate-vitamin D, Vitamin B-12 CR, multivitamin-iron-minerals-folic acid, omeprazole, beclomethasone, topiramate, Cholecalciferol, Sodium Hyaluronate, Corn Starch-Zinc Oxide, aspirin EC, and gabapentin. We administered ketorolac.  Meds ordered this encounter  Medications  . ketorolac (TORADOL) 30 MG/ML injection 30 mg  . methocarbamol (ROBAXIN) 750 MG tablet    Sig: Take 1 tablet (750 mg total) by mouth every 8 (eight) hours as needed for muscle spasms.    Dispense:  20 tablet    Refill:  0    Order Specific Question:   Supervising Provider    Answer:   Dianne Dun [[id number]]  . traMADol (ULTRAM) 50 MG tablet    Sig: Take 1 tablet (50 mg total) by mouth every 12 (twelve) hours as needed.    Dispense:  6 tablet    Refill:  0    Order Specific Question:   Supervising Provider    Answer:   Dianne Dun [[id number]]    Follow-up: No Follow-up on file.  Alysia Penna, NP

## 2017-12-26 ENCOUNTER — Ambulatory Visit: Payer: 59 | Admitting: Nurse Practitioner

## 2017-12-26 ENCOUNTER — Encounter: Payer: Self-pay | Admitting: Nurse Practitioner

## 2017-12-26 VITALS — BP 110/70 | HR 60 | Temp 98.7°F | Ht 62.0 in | Wt 181.0 lb

## 2017-12-26 DIAGNOSIS — Z20828 Contact with and (suspected) exposure to other viral communicable diseases: Secondary | ICD-10-CM

## 2017-12-26 DIAGNOSIS — R6889 Other general symptoms and signs: Secondary | ICD-10-CM

## 2017-12-26 LAB — POCT INFLUENZA A/B
INFLUENZA A, POC: NEGATIVE
Influenza B, POC: NEGATIVE

## 2017-12-26 MED ORDER — OSELTAMIVIR PHOSPHATE 75 MG PO CAPS: 75.0000 mg | ORAL_CAPSULE | Freq: Two times a day (BID) | ORAL | 0 refills | Status: DC

## 2017-12-26 MED ORDER — ONDANSETRON HCL 4 MG PO TABS: 4.0000 mg | ORAL_TABLET | Freq: Three times a day (TID) | ORAL | 0 refills | Status: DC | PRN

## 2017-12-26 NOTE — Progress Notes (Signed)
Subjective:  Patient ID: Elizabeth Thornton, female    DOB: 06/07/1971  Age: 47 y.o. MRN: [id number]  CC: Sore Throat (sore throat,bodyache,stuff neck,eyes burn,fever,chilles,weakness/1 day/)   URI   This is a new problem. The current episode started yesterday. The problem has been unchanged. The maximum temperature recorded prior to her arrival was 100.4 - 100.9 F. Associated symptoms include congestion, headaches, nausea, sinus pain, a sore throat and swollen glands. Pertinent negatives include no chest pain, coughing, diarrhea, dysuria, ear pain, joint swelling, neck pain, plugged ear sensation, rash, rhinorrhea, sneezing, vomiting or wheezing. She has tried acetaminophen for the symptoms. The treatment provided mild relief.  3coworkers diagnosed with influenza last week.  Outpatient Medications Prior to Visit  Medication Sig Dispense Refill  . albuterol (VENTOLIN HFA) 108 (90 BASE) MCG/ACT inhaler Frequency:PRN   Dosage:90   MCG  Instructions:  Note:Dose: 90 MCG    . aspirin EC 81 MG tablet Take 1 tablet (81 mg total) by mouth daily. 30 tablet   . beclomethasone (QVAR) 40 MCG/ACT inhaler Inhale into the lungs as needed.    . calcium citrate-vitamin D (CITRACAL+D) 315-200 MG-UNIT per tablet Take by mouth.    . Cholecalciferol (VITAMIN D-1000 MAX ST) 1000 units tablet Take 1,000 Units by mouth 1 day or 1 dose.    Marland Kitchen. Corn Starch-Zinc Oxide 81-15 % POWD Apply 1 application topically at bedtime. 142 g 0  . Cyanocobalamin (VITAMIN B-12 CR) 1000 MCG TBCR Take 2,500 mcg by mouth 2 (two) times daily.     Marland Kitchen. gabapentin (NEURONTIN) 300 MG capsule Take 1 capsule (300 mg total) by mouth at bedtime. 90 capsule 3  . methocarbamol (ROBAXIN) 750 MG tablet Take 1 tablet (750 mg total) by mouth every 8 (eight) hours as needed for muscle spasms. 20 tablet 0  . multivitamin-iron-minerals-folic acid (CENTRUM) chewable tablet Frequency:BID   Dosage:0.0     Instructions:  Note:Dose: 1    . omeprazole (PRILOSEC) 20  MG capsule Take 20 mg by mouth 3 times/day as needed-between meals & bedtime.     . Sodium Hyaluronate (EUFLEXXA) 20 MG/2ML SOSY Inject 10 mg as directed.    . traMADol (ULTRAM) 50 MG tablet Take 1 tablet (50 mg total) by mouth every 12 (twelve) hours as needed. 6 tablet 0  . topiramate (TOPAMAX) 50 MG tablet Take 50 mg by mouth 1 day or 1 dose.     No facility-administered medications prior to visit.     ROS See HPI  Objective:  BP 110/70   Pulse 60   Temp 98.7 F (37.1 C)   Ht 5\' 2"  (1.575 m)   Wt 181 lb (82.1 kg)   SpO2 97%   BMI 33.11 kg/m   BP Readings from Last 3 Encounters:  12/26/17 110/70  12/01/17 118/74  08/08/17 120/70    Wt Readings from Last 3 Encounters:  12/26/17 181 lb (82.1 kg)  12/01/17 175 lb (79.4 kg)  08/08/17 167 lb (75.8 kg)    Physical Exam  Constitutional: She is oriented to person, place, and time.  HENT:  Right Ear: Tympanic membrane, external ear and ear canal normal.  Left Ear: Tympanic membrane, external ear and ear canal normal.  Nose: Mucosal edema and rhinorrhea present. Right sinus exhibits maxillary sinus tenderness. Right sinus exhibits no frontal sinus tenderness. Left sinus exhibits maxillary sinus tenderness. Left sinus exhibits no frontal sinus tenderness.  Mouth/Throat: Uvula is midline. No trismus in the jaw. Posterior oropharyngeal erythema present. No oropharyngeal exudate.  Eyes: No scleral icterus.  Neck: Normal range of motion. Neck supple.  Cardiovascular: Normal rate and normal heart sounds.  Pulmonary/Chest: Effort normal and breath sounds normal.  Musculoskeletal: She exhibits no edema.  Lymphadenopathy:    She has cervical adenopathy.  Neurological: She is alert and oriented to person, place, and time.  Vitals reviewed.   Lab Results  Component Value Date   WBC 5.5 02/23/2017   HGB 12.2 02/23/2017   HCT 36.8 02/23/2017   PLT 288.0 02/23/2017   GLUCOSE 85 02/23/2017   CHOL 166 02/23/2017   TRIG 58.0  02/23/2017   HDL 52.70 02/23/2017   LDLCALC 102 (H) 02/23/2017   ALT 8 02/23/2017   AST 12 02/23/2017   NA 140 02/23/2017   K 4.2 02/23/2017   CL 110 02/23/2017   CREATININE 0.74 02/23/2017   BUN 19 02/23/2017   CO2 25 02/23/2017   TSH 2.95 02/23/2017   INR 1.06 08/03/2016    Assessment & Plan:   Laelynn was seen today for sore throat.  Diagnoses and all orders for this visit:  Flu-like symptoms -     POCT Influenza A/B -     oseltamivir (TAMIFLU) 75 MG capsule; Take 1 capsule (75 mg total) by mouth 2 (two) times daily. -     ondansetron (ZOFRAN) 4 MG tablet; Take 1 tablet (4 mg total) by mouth every 8 (eight) hours as needed for nausea or vomiting.  Exposure to influenza -     oseltamivir (TAMIFLU) 75 MG capsule; Take 1 capsule (75 mg total) by mouth 2 (two) times daily. -     ondansetron (ZOFRAN) 4 MG tablet; Take 1 tablet (4 mg total) by mouth every 8 (eight) hours as needed for nausea or vomiting.   I am having Leveta Champlain start on oseltamivir and ondansetron. I am also having her maintain her albuterol, calcium citrate-vitamin D, Vitamin B-12 CR, multivitamin-iron-minerals-folic acid, omeprazole, beclomethasone, topiramate, Cholecalciferol, Sodium Hyaluronate, Corn Starch-Zinc Oxide, aspirin EC, gabapentin, methocarbamol, and traMADol.  Meds ordered this encounter  Medications  . oseltamivir (TAMIFLU) 75 MG capsule    Sig: Take 1 capsule (75 mg total) by mouth 2 (two) times daily.    Dispense:  10 capsule    Refill:  0    Order Specific Question:   Supervising Provider    Answer:   Dianne Dun [[id number]]  . ondansetron (ZOFRAN) 4 MG tablet    Sig: Take 1 tablet (4 mg total) by mouth every 8 (eight) hours as needed for nausea or vomiting.    Dispense:  20 tablet    Refill:  0    Order Specific Question:   Supervising Provider    Answer:   Dianne Dun [[id number]]    Follow-up: No Follow-up on file.  Alysia Penna, NP

## 2017-12-26 NOTE — Patient Instructions (Signed)

## 2018-02-24 ENCOUNTER — Ambulatory Visit (INDEPENDENT_AMBULATORY_CARE_PROVIDER_SITE_OTHER): Payer: 59 | Admitting: Physician Assistant

## 2018-02-24 ENCOUNTER — Ambulatory Visit (INDEPENDENT_AMBULATORY_CARE_PROVIDER_SITE_OTHER): Payer: 59

## 2018-02-24 ENCOUNTER — Encounter (INDEPENDENT_AMBULATORY_CARE_PROVIDER_SITE_OTHER): Payer: Self-pay | Admitting: Physician Assistant

## 2018-02-24 DIAGNOSIS — M1712 Unilateral primary osteoarthritis, left knee: Secondary | ICD-10-CM

## 2018-02-24 MED ORDER — METHYLPREDNISOLONE ACETATE 40 MG/ML IJ SUSP
40.0000 mg | INTRAMUSCULAR | Status: AC | PRN
  Administered 2018-02-24: 40 mg via INTRA_ARTICULAR

## 2018-02-24 MED ORDER — LIDOCAINE HCL 1 % IJ SOLN
2.0000 mL | INTRAMUSCULAR | Status: AC | PRN
  Administered 2018-02-24: 2 mL

## 2018-02-24 MED ORDER — BUPIVACAINE HCL 0.25 % IJ SOLN
2.0000 mL | INTRAMUSCULAR | Status: AC | PRN
  Administered 2018-02-24: 2 mL via INTRA_ARTICULAR

## 2018-02-24 NOTE — Progress Notes (Signed)
Office Visit Note   Patient: Elizabeth Thornton           Date of Birth: [birth year]           MRN: [id number] Visit Date: 02/24/2018              Requested by: Anne Ng, NP [address] [place], [place] [address] PCP: Anne Ng, NP   Assessment & Plan: Visit Diagnoses:  1. Primary osteoarthritis of left knee     Plan: Impression is left knee degenerative medial meniscus tear.  At this point, we will proceed with an intra-articular cortisone injection to hopefully settle things down.  She will let us know the next few weeks if she is not any better at that point we may try proceeding with Euflexxa injections.  She has had great results with those on the right.  Follow-up as needed.  Call if concerns or questions in the meantime.  Follow-Up Instructions: Return if symptoms worsen or fail to improve.   Orders:  Orders Placed This Encounter  Procedures  . Large Joint Inj: L knee  . XR KNEE 3 VIEW LEFT   No orders of the defined types were placed in this encounter.     Procedures: Large Joint Inj: L knee on 02/24/2018 10:53 AM Indications: pain Details: 22 G needle, anterolateral approach Medications: 2 mL lidocaine 1 %; 2 mL bupivacaine 0.25 %; 40 mg methylPREDNISolone acetate 40 MG/ML      Clinical Data: No additional findings.   Subjective: Chief Complaint  Patient presents with  . Left Knee - Pain    HPI patient is a pleasant 47 year old female who presents to our clinic today with recurrent left knee pain.  History of osteoarthritis.  She is also had a left knee arthroscopic debridement a few years back.  She has been getting intermittent cortisone injections with moderate relief of symptoms.  This morning, she twisted her left knee causing severe pain to the medial aspect.  Since then she has had moderate pain with associated swelling.  Worse with ambulation.  She does come in today utilizing crutches.  No numbness tingling or  burning.  Review of Systems as detailed in HPI.  All others are negative.   Objective: Vital Signs: There were no vitals taken for this visit.  Physical Exam well-developed well-nourished female no acute distress.  Alert and oriented x3.  Ortho Exam examination of the left knee shows a trace effusion.  Marked tenderness medial joint line with a positive medial McMurray.  She has stable valgus varus stress.  Moderate patellofemoral crepitus.  Range of motion 0 to 100 degrees.  She is neurovascularly intact distally.  Specialty Comments:  No specialty comments available.  Imaging: Xr Knee 3 View Left  Result Date: 02/24/2018 X-rays show decreased joint space narrowing medial and lateral compartments with moderate patellofemoral joint space narrowing.    PMFS History: Patient Active Problem List   Diagnosis Date Noted  . H/O gastric bypass 02/17/2017  . Excess skin of abdominal wall 02/17/2017  . Muscle cramps at night 02/17/2017  . DVT (deep venous thrombosis) (HCC) 08/26/2016  . Fibromyalgia 08/26/2016  . Primary osteoarthritis of left knee 08/26/2016   Past Medical History:  Diagnosis Date  . Allergy   . Arthritis   . Asthma   . GERD (gastroesophageal reflux disease)   . Hypertension     Family History  Problem Relation Age of Onset  . Cancer Mother   . Arthritis  Mother   . Hypertension Mother   . Hyperlipidemia Mother   . Diabetes Mother   . Antithrombin III deficiency Mother   . Hypertension Father   . Hyperlipidemia Father   . Diabetes Father   . Cancer Maternal Aunt   . Cancer Maternal Uncle   . Arthritis Maternal Grandmother   . Arthritis Maternal Grandfather   . Arthritis Paternal Grandmother   . Arthritis Paternal Grandfather     Past Surgical History:  Procedure Laterality Date  . gastric bipass     done 2013  . MENISCUS REPAIR Bilateral    2006 and 2008  . roux n y  06/26/2012   gastric by pass   Social History   Occupational History  .  Not on file  Tobacco Use  . Smoking status: Never Smoker  . Smokeless tobacco: Never Used  Substance and Sexual Activity  . Alcohol use: No  . Drug use: No  . Sexual activity: Yes    Birth control/protection: IUD    Comment: mirena

## 2018-03-08 LAB — HM PAP SMEAR

## 2018-04-29 ENCOUNTER — Encounter: Payer: Self-pay | Admitting: Nurse Practitioner

## 2018-05-10 ENCOUNTER — Encounter: Payer: Self-pay | Admitting: Nurse Practitioner

## 2018-05-16 ENCOUNTER — Encounter: Payer: Self-pay | Admitting: Nurse Practitioner

## 2018-08-15 ENCOUNTER — Encounter: Payer: Self-pay | Admitting: Nurse Practitioner

## 2018-08-15 ENCOUNTER — Ambulatory Visit (INDEPENDENT_AMBULATORY_CARE_PROVIDER_SITE_OTHER): Payer: 59 | Admitting: Nurse Practitioner

## 2018-08-15 ENCOUNTER — Other Ambulatory Visit (INDEPENDENT_AMBULATORY_CARE_PROVIDER_SITE_OTHER): Payer: 59

## 2018-08-15 ENCOUNTER — Ambulatory Visit (INDEPENDENT_AMBULATORY_CARE_PROVIDER_SITE_OTHER): Payer: 59 | Admitting: *Deleted

## 2018-08-15 VITALS — BP 118/76 | HR 58 | Temp 97.8°F | Ht 62.0 in | Wt 169.0 lb

## 2018-08-15 DIAGNOSIS — Z23 Encounter for immunization: Secondary | ICD-10-CM

## 2018-08-15 DIAGNOSIS — D649 Anemia, unspecified: Secondary | ICD-10-CM | POA: Diagnosis not present

## 2018-08-15 DIAGNOSIS — E559 Vitamin D deficiency, unspecified: Secondary | ICD-10-CM | POA: Diagnosis not present

## 2018-08-15 DIAGNOSIS — Z13220 Encounter for screening for lipoid disorders: Secondary | ICD-10-CM

## 2018-08-15 DIAGNOSIS — Z136 Encounter for screening for cardiovascular disorders: Secondary | ICD-10-CM

## 2018-08-15 DIAGNOSIS — M797 Fibromyalgia: Secondary | ICD-10-CM

## 2018-08-15 DIAGNOSIS — Z0001 Encounter for general adult medical examination with abnormal findings: Secondary | ICD-10-CM

## 2018-08-15 LAB — CBC
HEMATOCRIT: 33.9 % — AB (ref 36.0–46.0)
HEMOGLOBIN: 10.9 g/dL — AB (ref 12.0–15.0)
MCHC: 32.2 g/dL (ref 30.0–36.0)
MCV: 84.2 fl (ref 78.0–100.0)
Platelets: 340 10*3/uL (ref 150.0–400.0)
RBC: 4.02 Mil/uL (ref 3.87–5.11)
RDW: 16.6 % — ABNORMAL HIGH (ref 11.5–15.5)
WBC: 5.2 10*3/uL (ref 4.0–10.5)

## 2018-08-15 LAB — COMPREHENSIVE METABOLIC PANEL
ALBUMIN: 3.7 g/dL (ref 3.5–5.2)
ALT: 9 U/L (ref 0–35)
AST: 11 U/L (ref 0–37)
Alkaline Phosphatase: 36 U/L — ABNORMAL LOW (ref 39–117)
BILIRUBIN TOTAL: 0.5 mg/dL (ref 0.2–1.2)
BUN: 15 mg/dL (ref 6–23)
CALCIUM: 9 mg/dL (ref 8.4–10.5)
CO2: 26 meq/L (ref 19–32)
CREATININE: 0.62 mg/dL (ref 0.40–1.20)
Chloride: 106 mEq/L (ref 96–112)
GFR: 108.68 mL/min (ref >60.00)
Glucose, Bld: 85 mg/dL (ref 70–99)
Potassium: 3.7 mEq/L (ref 3.5–5.1)
Sodium: 139 mEq/L (ref 135–145)
Total Protein: 6.5 g/dL (ref 6.0–8.3)

## 2018-08-15 LAB — LIPID PANEL
CHOLESTEROL: 167 mg/dL (ref 0–200)
HDL: 60.5 mg/dL (ref >39.00)
LDL Cholesterol: 95 mg/dL (ref 0–99)
NonHDL: 106.04
TRIGLYCERIDES: 54 mg/dL (ref 0.0–149.0)
Total CHOL/HDL Ratio: 3
VLDL: 10.8 mg/dL (ref 0.0–40.0)

## 2018-08-15 LAB — TSH: TSH: 2.17 u[IU]/mL (ref 0.35–4.50)

## 2018-08-15 MED ORDER — GABAPENTIN 100 MG PO CAPS: ORAL_CAPSULE | ORAL | 11 refills | Status: DC

## 2018-08-15 NOTE — Progress Notes (Signed)
Subjective:    Patient ID: Elizabeth Thornton, female    DOB: 11/20/1970, 47 y.o.   MRN: [id number]  Patient presents today for complete physical and discuss weaning off gabapentin.  HPI  Chronic pain and muscle pain:  current use of athrocen (avocado and soy) supplement x30months, to stop degenerative process of OA. Reports improved joint pain and stiffness. Also changed diet to gluten free. Was not able to have excess skin removed due to high cost of procedure  Sexual History (orientation,birth control, marital status, STD):IUD present, needs to be replaced, will schedule with GYN  Depression/Suicide: Depression screen [facility] 2/9 08/15/2018 08/08/2017 02/17/2017  Decreased Interest 0 0 0  Down, Depressed, Hopeless 0 0 0  PHQ - 2 Score 0 0 0   No flowsheet data found.  Vision:up to date, use of corrective lens  Dental: needed, will schedule  Immunizations: (TDAP, Hep C screen, Pneumovax, Influenza, zoster)  Health Maintenance  Topic Date Due  . HIV Screening  08/16/2019*  . Pap Smear  02/18/2020  . Tetanus Vaccine  08/26/2026  . Flu Shot  Completed  *Topic was postponed. The date shown is not the original due date.   Weight:  Wt Readings from Last 3 Encounters:  08/15/18 169 lb (76.7 kg)  12/26/17 181 lb (82.1 kg)  12/01/17 175 lb (79.4 kg)   Exercise:walking, low impact cardio due to severe OA in knees  Fall Risk: Fall Risk  08/15/2018 08/08/2017 02/17/2017  Falls in the past year? No Yes No  Number falls in past yr: - 1 -  Injury with Fall? - Yes -   Advanced Directive: Advanced Directives 08/03/2016  Does Patient Have a Medical Advance Directive? No  Would patient like information on creating a medical advance directive? No - patient declined information     Medications and allergies reviewed with patient and updated if appropriate.  Patient Active Problem List   Diagnosis Date Noted  . H/O gastric bypass 02/17/2017  . Excess skin of abdominal wall 02/17/2017    . Muscle cramps at night 02/17/2017  . DVT (deep venous thrombosis) (HCC) 08/26/2016  . Fibromyalgia 08/26/2016  . Primary osteoarthritis of left knee 08/26/2016    Current Outpatient Medications on File Prior to Visit  Medication Sig Dispense Refill  . albuterol (VENTOLIN HFA) 108 (90 BASE) MCG/ACT inhaler Frequency:PRN   Dosage:90   MCG  Instructions:  Note:Dose: 90 MCG    . calcium citrate-vitamin D (CITRACAL+D) 315-200 MG-UNIT per tablet Take by mouth.    . Cholecalciferol (VITAMIN D-1000 MAX ST) 1000 units tablet Take 1,000 Units by mouth 1 day or 1 dose.    . Cyanocobalamin (VITAMIN B-12 CR) 1000 MCG TBCR Take 2,500 mcg by mouth 2 (two) times daily.     . multivitamin-iron-minerals-folic acid (CENTRUM) chewable tablet Frequency:BID   Dosage:0.0     Instructions:  Note:Dose: 1    . OVER THE COUNTER MEDICATION Acocado/Soyunsaponifiables 300 mg 1 tab by mouth daily. OTC for joints.    . Sodium Hyaluronate (EUFLEXXA) 20 MG/2ML SOSY Inject 10 mg as directed.    . traMADol (ULTRAM) 50 MG tablet Take 1 tablet (50 mg total) by mouth every 12 (twelve) hours as needed. 6 tablet 0  . beclomethasone (QVAR) 40 MCG/ACT inhaler Inhale into the lungs as needed.    . topiramate (TOPAMAX) 50 MG tablet Take 50 mg by mouth 1 day or 1 dose.     No current facility-administered medications on file prior to visit.  Past Medical History:  Diagnosis Date  . Allergy   . Arthritis   . Asthma   . GERD (gastroesophageal reflux disease)   . Hypertension     Past Surgical History:  Procedure Laterality Date  . gastric bipass     done 2013  . MENISCUS REPAIR Bilateral    2006 and 2008  . roux n y  06/26/2012   gastric by pass    Social History   Socioeconomic History  . Marital status: Single    Spouse name: Not on file  . Number of children: Not on file  . Years of education: Not on file  . Highest education level: Not on file  Occupational History  . Not on file  Social Needs  .  Financial resource strain: Not on file  . Food insecurity:    Worry: Not on file    Inability: Not on file  . Transportation needs:    Medical: Not on file    Non-medical: Not on file  Tobacco Use  . Smoking status: Never Smoker  . Smokeless tobacco: Never Used  Substance and Sexual Activity  . Alcohol use: No  . Drug use: No  . Sexual activity: Yes    Birth control/protection: IUD    Comment: mirena  Lifestyle  . Physical activity:    Days per week: Not on file    Minutes per session: Not on file  . Stress: Not on file  Relationships  . Social connections:    Talks on phone: Not on file    Gets together: Not on file    Attends religious service: Not on file    Active member of club or organization: Not on file    Attends meetings of clubs or organizations: Not on file    Relationship status: Not on file  Other Topics Concern  . Not on file  Social History Narrative  . Not on file    Family History  Problem Relation Age of Onset  . Cancer Mother 58       uterine  . Arthritis Mother   . Hypertension Mother   . Hyperlipidemia Mother   . Diabetes Mother   . Antithrombin III deficiency Mother   . Hypertension Father   . Hyperlipidemia Father   . Diabetes Father   . Cancer Maternal Aunt 61       melanoma with mets to uterine, kidney, lung, brain  . Cancer Maternal Uncle 60       stomach  . Antithrombin III deficiency Maternal Uncle   . Arthritis Maternal Grandmother   . Cancer Maternal Grandmother 86       liver  . Arthritis Maternal Grandfather   . Arthritis Paternal Grandmother   . Arthritis Paternal Grandfather        Review of Systems  Constitutional: Negative for fever, malaise/fatigue and weight loss.  HENT: Negative for congestion and sore throat.   Eyes:       Negative for visual changes  Respiratory: Negative for cough and shortness of breath.   Cardiovascular: Negative for chest pain, palpitations and leg swelling.  Gastrointestinal: Negative  for blood in stool, constipation, diarrhea and heartburn.  Genitourinary: Negative for dysuria, frequency and urgency.  Musculoskeletal: Negative for falls, joint pain and myalgias.  Skin: Negative for rash.  Neurological: Negative for dizziness, sensory change and headaches.  Endo/Heme/Allergies: Does not bruise/bleed easily.  Psychiatric/Behavioral: Negative for depression, substance abuse and suicidal ideas. The patient is not nervous/anxious.  Objective:   Vitals:   08/15/18 0808  BP: 118/76  Pulse: (!) 58  Temp: 97.8 F (36.6 C)  SpO2: 99%    Body mass index is 30.91 kg/m.   Physical Examination:  Physical Exam  Constitutional: She is oriented to person, place, and time. She appears well-developed and well-nourished.  Eyes: Pupils are equal, round, and reactive to light. Conjunctivae and EOM are normal.  Neck: Normal range of motion. Neck supple. No JVD present. No thyromegaly present.  Cardiovascular: Normal rate, regular rhythm, normal heart sounds and intact distal pulses.  Pulmonary/Chest: Effort normal and breath sounds normal. Right breast exhibits no inverted nipple, no mass, no nipple discharge, no skin change and no tenderness. Left breast exhibits no inverted nipple, no mass, no nipple discharge, no skin change and no tenderness.  Abdominal: Soft. Bowel sounds are normal. She exhibits no distension. There is no tenderness.  Genitourinary:  Genitourinary Comments: Deferred to GYN  Musculoskeletal: She exhibits no edema.  Lymphadenopathy:    She has no cervical adenopathy.  Neurological: She is alert and oriented to person, place, and time.  Skin: Skin is warm and dry. No rash noted.  Psychiatric: She has a normal mood and affect. Her behavior is normal. Thought content normal.  Vitals reviewed.  ASSESSMENT and PLAN:  Sahithi was seen today for annual exam.  Diagnoses and all orders for this visit:  Encounter for preventative adult health care exam  with abnormal findings -     TSH; Future -     CBC; Future -     Lipid panel; Future -     Comprehensive metabolic panel; Future  Fibromyalgia -     gabapentin (NEURONTIN) 100 MG capsule; 2caps at bedtime x 2weeks, then 1cap at bedtime x 2weeks, then 1cap 3times a week continuously.  Encounter for lipid screening for cardiovascular disease -     Lipid panel; Future  Vitamin D insufficiency -     Vitamin D 1,25 dihydroxy; Future  Anemia, unspecified type -     CBC; Future   No problem-specific Assessment & Plan notes found for this encounter.     Follow up: No follow-ups on file.  Alysia Penna, NP

## 2018-08-15 NOTE — Patient Instructions (Addendum)
Please have last mammogram reports faxed to me.  Schedule dental cleaning every 29monthor once a year.  Schedule appt with GYN for PAP and replace IUD.  Stable CMP, lipid panel, and TSH. CBC indicates a decrease in Hgb and Hct. Return to lab to repeat cbc in 173monthLet me know if you develop epigastric pain or black tarry stool or bright red blood in stool. [facility].  Gabapentin weaning: 2caps at bedtime x 2weeks, then 1cap at bedtime x 2weeks, then 1cap 3times a week continuously.  Health Maintenance, Female Adopting a healthy lifestyle and getting preventive care can go a long way to promote health and wellness. Talk with your health care provider about what schedule of regular examinations is right for you. This is a good chance for you to check in with your provider about disease prevention and staying healthy. In between checkups, there are plenty of things you can do on your own. Experts have done a lot of research about which lifestyle changes and preventive measures are most likely to keep you healthy. Ask your health care provider for more information. Weight and diet Eat a healthy diet  Be sure to include plenty of vegetables, fruits, low-fat dairy products, and lean protein.  Do not eat a lot of foods high in solid fats, added sugars, or salt.  Get regular exercise. This is one of the most important things you can do for your health. ? Most adults should exercise for at least 150 minutes each week. The exercise should increase your heart rate and make you sweat (moderate-intensity exercise). ? Most adults should also do strengthening exercises at least twice a week. This is in addition to the moderate-intensity exercise.  Maintain a healthy weight  Body mass index (BMI) is a measurement that can be used to identify possible weight problems. It estimates body fat based on height and weight. Your health care provider can help determine your BMI  and help you achieve or maintain a healthy weight.  For females 2016ears of age and older: ? A BMI below 18.5 is considered underweight. ? A BMI of 18.5 to 24.9 is normal. ? A BMI of 25 to 29.9 is considered overweight. ? A BMI of 30 and above is considered obese.  Watch levels of cholesterol and blood lipids  You should start having your blood tested for lipids and cholesterol at 2050ears of age, then have this test every 5 years.  You may need to have your cholesterol levels checked more often if: ? Your lipid or cholesterol levels are high. ? You are older than 5086ears of age. ? You are at high risk for heart disease.  Cancer screening Lung Cancer  Lung cancer screening is recommended for adults [id number] old who are at high risk for lung cancer because of a history of smoking.  A yearly low-dose CT scan of the lungs is recommended for people who: ? Currently smoke. ? Have quit within the past 15 years. ? Have at least a 30-pack-year history of smoking. A pack year is smoking an average of one pack of cigarettes a day for 1 year.  Yearly screening should continue until it has been 15 years since you quit.  Yearly screening should stop if you develop a health problem that would prevent you from having lung cancer treatment.  Breast Cancer  Practice breast self-awareness. This means understanding how your breasts normally appear and feel.  It also  means doing regular breast self-exams. Let your health care provider know about any changes, no matter how small.  If you are in your 20s or 30s, you should have a clinical breast exam (CBE) by a health care provider every 1-3 years as part of a regular health exam.  If you are 40 or older, have a CBE every year. Also consider having a breast X-ray (mammogram) every year.  If you have a family history of breast cancer, talk to your health care provider about genetic screening.  If you are at high risk for breast cancer, talk  to your health care provider about having an MRI and a mammogram every year.  Breast cancer gene (BRCA) assessment is recommended for women who have family members with BRCA-related cancers. BRCA-related cancers include: ? Breast. ? Ovarian. ? Tubal. ? Peritoneal cancers.  Results of the assessment will determine the need for genetic counseling and BRCA1 and BRCA2 testing.  Cervical Cancer Your health care provider may recommend that you be screened regularly for cancer of the pelvic organs (ovaries, uterus, and vagina). This screening involves a pelvic examination, including checking for microscopic changes to the surface of your cervix (Pap test). You may be encouraged to have this screening done every 3 years, beginning at age 21.  For women ages 30-65, health care providers may recommend pelvic exams and Pap testing every 3 years, or they may recommend the Pap and pelvic exam, combined with testing for human papilloma virus (HPV), every 5 years. Some types of HPV increase your risk of cervical cancer. Testing for HPV may also be done on women of any age with unclear Pap test results.  Other health care providers may not recommend any screening for nonpregnant women who are considered low risk for pelvic cancer and who do not have symptoms. Ask your health care provider if a screening pelvic exam is right for you.  If you have had past treatment for cervical cancer or a condition that could lead to cancer, you need Pap tests and screening for cancer for at least 20 years after your treatment. If Pap tests have been discontinued, your risk factors (such as having a new sexual partner) need to be reassessed to determine if screening should resume. Some women have medical problems that increase the chance of getting cervical cancer. In these cases, your health care provider may recommend more frequent screening and Pap tests.  Colorectal Cancer  This type of cancer can be detected and often  prevented.  Routine colorectal cancer screening usually begins at 50 years of age and continues through 49 years of age.  Your health care provider may recommend screening at an earlier age if you have risk factors for colon cancer.  Your health care provider may also recommend using home test kits to check for hidden blood in the stool.  A small camera at the end of a tube can be used to examine your colon directly (sigmoidoscopy or colonoscopy). This is done to check for the earliest forms of colorectal cancer.  Routine screening usually begins at age 50.  Direct examination of the colon should be repeated every 5-10 years through 49 years of age. However, you may need to be screened more often if early forms of precancerous polyps or small growths are found.  Skin Cancer  Check your skin from head to toe regularly.  Tell your health care provider about any new moles or changes in moles, especially if there is a change in   a mole's shape or color.  Also tell your health care provider if you have a mole that is larger than the size of a pencil eraser.  Always use sunscreen. Apply sunscreen liberally and repeatedly throughout the day.  Protect yourself by wearing long sleeves, pants, a wide-brimmed hat, and sunglasses whenever you are outside.  Heart disease, diabetes, and high blood pressure  High blood pressure causes heart disease and increases the risk of stroke. High blood pressure is more likely to develop in: ? People who have blood pressure in the high end of the normal range (130-139/85-89 mm Hg). ? People who are overweight or obese. ? People who are African American.  If you are 42-58 years of age, have your blood pressure checked every 3-5 years. If you are 58 years of age or older, have your blood pressure checked every year. You should have your blood pressure measured twice-once when you are at a hospital or clinic, and once when you are not at a hospital or clinic.  Record the average of the two measurements. To check your blood pressure when you are not at a hospital or clinic, you can use: ? An automated blood pressure machine at a pharmacy. ? A home blood pressure monitor.  If you are between 55 years and 78 years old, ask your health care provider if you should take aspirin to prevent strokes.  Have regular diabetes screenings. This involves taking a blood sample to check your fasting blood sugar level. ? If you are at a normal weight and have a low risk for diabetes, have this test once every three years after 49 years of age. ? If you are overweight and have a high risk for diabetes, consider being tested at a younger age or more often. Preventing infection Hepatitis B  If you have a higher risk for hepatitis B, you should be screened for this virus. You are considered at high risk for hepatitis B if: ? You were born in a country where hepatitis B is common. Ask your health care provider which countries are considered high risk. ? Your parents were born in a high-risk country, and you have not been immunized against hepatitis B (hepatitis B vaccine). ? You have HIV or AIDS. ? You use needles to inject street drugs. ? You live with someone who has hepatitis B. ? You have had sex with someone who has hepatitis B. ? You get hemodialysis treatment. ? You take certain medicines for conditions, including cancer, organ transplantation, and autoimmune conditions.  Hepatitis C  Blood testing is recommended for: ? Everyone born from [birth year] through 1967. ? Anyone with known risk factors for hepatitis C.  Sexually transmitted infections (STIs)  You should be screened for sexually transmitted infections (STIs) including gonorrhea and chlamydia if: ? You are sexually active and are younger than 49 years of age. ? You are older than 49 years of age and your health care provider tells you that you are at risk for this type of infection. ? Your sexual  activity has changed since you were last screened and you are at an increased risk for chlamydia or gonorrhea. Ask your health care provider if you are at risk.  If you do not have HIV, but are at risk, it may be recommended that you take a prescription medicine daily to prevent HIV infection. This is called pre-exposure prophylaxis (PrEP). You are considered at risk if: ? You are sexually active and do not regularly use condoms  or know the HIV status of your partner(s). ? You take drugs by injection. ? You are sexually active with a partner who has HIV.  Talk with your health care provider about whether you are at high risk of being infected with HIV. If you choose to begin PrEP, you should first be tested for HIV. You should then be tested every 3 months for as long as you are taking PrEP. Pregnancy  If you are premenopausal and you may become pregnant, ask your health care provider about preconception counseling.  If you may become pregnant, take 400 to 800 micrograms (mcg) of folic acid every day.  If you want to prevent pregnancy, talk to your health care provider about birth control (contraception). Osteoporosis and menopause  Osteoporosis is a disease in which the bones lose minerals and strength with aging. This can result in serious bone fractures. Your risk for osteoporosis can be identified using a bone density scan.  If you are 54 years of age or older, or if you are at risk for osteoporosis and fractures, ask your health care provider if you should be screened.  Ask your health care provider whether you should take a calcium or vitamin D supplement to lower your risk for osteoporosis.  Menopause may have certain physical symptoms and risks.  Hormone replacement therapy may reduce some of these symptoms and risks. Talk to your health care provider about whether hormone replacement therapy is right for you. Follow these instructions at home:  Schedule regular health, dental,  and eye exams.  Stay current with your immunizations.  Do not use any tobacco products including cigarettes, chewing tobacco, or electronic cigarettes.  If you are pregnant, do not drink alcohol.  If you are breastfeeding, limit how much and how often you drink alcohol.  Limit alcohol intake to no more than 1 drink per day for nonpregnant women. One drink equals 12 ounces of beer, 5 ounces of wine, or 1 ounces of hard liquor.  Do not use street drugs.  Do not share needles.  Ask your health care provider for help if you need support or information about quitting drugs.  Tell your health care provider if you often feel depressed.  Tell your health care provider if you have ever been abused or do not feel safe at home. This information is not intended to replace advice given to you by your health care provider. Make sure you discuss any questions you have with your health care provider. Document Released: 05/03/2011 Document Revised: 03/25/2016 Document Reviewed: 07/22/2015 Elsevier Interactive Patient Education  [employer].

## 2018-08-16 ENCOUNTER — Encounter: Payer: Self-pay | Admitting: Nurse Practitioner

## 2018-08-18 LAB — VITAMIN D 1,25 DIHYDROXY
VITAMIN D3 1, 25 (OH): 48 pg/mL
Vitamin D 1, 25 (OH)2 Total: 48 pg/mL (ref 18–72)
Vitamin D2 1, 25 (OH)2: <8 pg/mL

## 2018-11-07 DIAGNOSIS — H5213 Myopia, bilateral: Secondary | ICD-10-CM | POA: Diagnosis not present

## 2018-11-07 DIAGNOSIS — H547 Unspecified visual loss: Secondary | ICD-10-CM | POA: Diagnosis not present

## 2019-01-11 ENCOUNTER — Encounter: Payer: Self-pay | Admitting: Nurse Practitioner

## 2019-01-11 DIAGNOSIS — J9801 Acute bronchospasm: Principal | ICD-10-CM

## 2019-01-11 DIAGNOSIS — B349 Viral infection, unspecified: Secondary | ICD-10-CM

## 2019-01-11 MED ORDER — BECLOMETHASONE DIPROPIONATE 40 MCG/ACT IN AERS: 2.0000 | INHALATION_SPRAY | Freq: Two times a day (BID) | RESPIRATORY_TRACT | 1 refills | Status: DC

## 2019-01-11 MED ORDER — ALBUTEROL SULFATE 108 (90 BASE) MCG/ACT IN AEPB: 1.0000 | INHALATION_SPRAY | Freq: Four times a day (QID) | RESPIRATORY_TRACT | 0 refills | Status: DC | PRN

## 2019-05-28 ENCOUNTER — Telehealth (INDEPENDENT_AMBULATORY_CARE_PROVIDER_SITE_OTHER): Payer: 59 | Admitting: Nurse Practitioner

## 2019-05-28 ENCOUNTER — Other Ambulatory Visit: Payer: Self-pay

## 2019-05-28 ENCOUNTER — Encounter: Payer: Self-pay | Admitting: Nurse Practitioner

## 2019-05-28 VITALS — BP 112/58 | HR 68 | Temp 99.9°F | Ht 62.0 in | Wt 173.0 lb

## 2019-05-28 DIAGNOSIS — R509 Fever, unspecified: Secondary | ICD-10-CM | POA: Diagnosis not present

## 2019-05-28 DIAGNOSIS — R05 Cough: Secondary | ICD-10-CM

## 2019-05-28 DIAGNOSIS — M791 Myalgia, unspecified site: Secondary | ICD-10-CM | POA: Diagnosis not present

## 2019-05-28 DIAGNOSIS — R6889 Other general symptoms and signs: Secondary | ICD-10-CM

## 2019-05-28 DIAGNOSIS — J029 Acute pharyngitis, unspecified: Secondary | ICD-10-CM

## 2019-05-28 DIAGNOSIS — R51 Headache: Secondary | ICD-10-CM

## 2019-05-28 DIAGNOSIS — R0981 Nasal congestion: Secondary | ICD-10-CM

## 2019-05-28 DIAGNOSIS — R0602 Shortness of breath: Secondary | ICD-10-CM

## 2019-05-28 MED ORDER — DM-GUAIFENESIN ER 30-600 MG PO TB12: 1.0000 | ORAL_TABLET | Freq: Two times a day (BID) | ORAL | 0 refills | Status: DC | PRN

## 2019-05-28 MED ORDER — ACETAMINOPHEN 500 MG PO TABS: 500.0000 mg | ORAL_TABLET | Freq: Three times a day (TID) | ORAL | 0 refills | Status: AC | PRN

## 2019-05-28 NOTE — Patient Instructions (Signed)
Have COVID result faxed to office as soon as possible. Work note provided. Maintain adequate oral hydration with water and pedialyte/gartorade Use tylenol for fever and pain. Use robitussin or mucinex for cough.

## 2019-05-28 NOTE — Progress Notes (Signed)
Virtual Visit via Video Note  I connected with Elizabeth Thornton on 05/28/19 at 10:15 AM EDT by a video enabled telemedicine application and verified that I am speaking with the correct person using two identifiers.  Location: Patient: Home Provider: Office   I discussed the limitations of evaluation and management by telemedicine and the availability of in person appointments. The patient expressed understanding and agreed to proceed.  CC: flu like symptoms:pt is at home--c/o of fever of 101F, bodyache,headache,sore throat,runny nose,dry cough,fatigue-not want to eat/going on 2 days/no oct/ FYI--pt's father tested on Saturday for [place] (waiting for result)--he went travel to [place] recently.   History of Present Illness: Cough This is a new problem. The current episode started in the past 7 days. The problem has been unchanged. The cough is non-productive. Associated symptoms include chills, a fever, headaches, myalgias, nasal congestion, a sore throat and shortness of breath. Pertinent negatives include no chest pain, heartburn, hemoptysis, postnasal drip, rash, rhinorrhea or wheezing. She has tried steroid inhaler for the symptoms. The treatment provided mild relief. Her past medical history is significant for asthma and environmental allergies.  she has appt at noon for COVID test at testing site at work.   Observations/Objective: Physical Exam  Constitutional: She is oriented to person, place, and time. No distress.  Pulmonary/Chest: Effort normal.  Neurological: She is alert and oriented to person, place, and time.  Skin: Skin is dry.  Vitals reviewed.   Assessment and Plan: Elizabeth Thornton was seen today for cough.  Diagnoses and all orders for this visit:  Flu-like symptoms -     acetaminophen (TYLENOL) 500 MG tablet; Take 1 tablet (500 mg total) by mouth every 8 (eight) hours as needed. -     dextromethorphan-guaiFENesin (MUCINEX DM) 30-600 MG 12hr tablet; Take 1 tablet by  mouth 2 (two) times daily as needed for cough.   Follow Up Instructions: Have COVID result faxed to office as soon as possible. Work note provided. Maintain adequate oral hydration with water and pedialyte/gartorade Use tylenol for fever and pain. Use robitussin or mucinex for cough.   I discussed the assessment and treatment plan with the patient. The patient was provided an opportunity to ask questions and all were answered. The patient agreed with the plan and demonstrated an understanding of the instructions.   The patient was advised to call back or seek an in-person evaluation if the symptoms worsen or if the condition fails to improve as anticipated.  Wilfred Lacy, NP

## 2019-05-30 LAB — NOVEL CORONAVIRUS, NAA: SARS-CoV-2, NAA: POSITIVE

## 2019-05-31 ENCOUNTER — Encounter: Payer: Self-pay | Admitting: Nurse Practitioner

## 2019-05-31 DIAGNOSIS — J069 Acute upper respiratory infection, unspecified: Secondary | ICD-10-CM

## 2019-05-31 DIAGNOSIS — U071 COVID-19: Secondary | ICD-10-CM

## 2019-05-31 MED ORDER — PROMETHAZINE-DM 6.25-15 MG/5ML PO SYRP: 5.0000 mL | ORAL_SOLUTION | Freq: Three times a day (TID) | ORAL | 0 refills | Status: DC | PRN

## 2019-06-03 ENCOUNTER — Encounter: Payer: Self-pay | Admitting: Nurse Practitioner

## 2019-06-04 NOTE — Telephone Encounter (Signed)
Good Morning, Please contact patient and clarify for symptoms. IF breathing is worse as she says, please advise to go to ED for additional evaluation. When going she should wear a mask and inform them that she is COVID positive.  Thank you

## 2019-06-20 ENCOUNTER — Encounter: Payer: Self-pay | Admitting: Nurse Practitioner

## 2019-06-20 NOTE — Progress Notes (Signed)
Abstracted result and sent to scan  

## 2019-06-21 LAB — HM MAMMOGRAPHY

## 2019-08-08 ENCOUNTER — Encounter: Payer: Self-pay | Admitting: Orthopaedic Surgery

## 2019-08-08 ENCOUNTER — Ambulatory Visit: Payer: Self-pay

## 2019-08-08 ENCOUNTER — Ambulatory Visit (INDEPENDENT_AMBULATORY_CARE_PROVIDER_SITE_OTHER): Payer: 59 | Admitting: Orthopaedic Surgery

## 2019-08-08 VITALS — Ht 62.0 in | Wt 180.0 lb

## 2019-08-08 DIAGNOSIS — M25512 Pain in left shoulder: Secondary | ICD-10-CM

## 2019-08-08 MED ORDER — LIDOCAINE HCL 1 % IJ SOLN
3.0000 mL | INTRAMUSCULAR | Status: AC | PRN
  Administered 2019-08-08: 3 mL

## 2019-08-08 MED ORDER — METHYLPREDNISOLONE ACETATE 40 MG/ML IJ SUSP
40.0000 mg | INTRAMUSCULAR | Status: AC | PRN
  Administered 2019-08-08: 40 mg via INTRA_ARTICULAR

## 2019-08-08 MED ORDER — BUPIVACAINE HCL 0.5 % IJ SOLN
3.0000 mL | INTRAMUSCULAR | Status: AC | PRN
  Administered 2019-08-08: 3 mL via INTRA_ARTICULAR

## 2019-08-08 NOTE — Progress Notes (Signed)
Office Visit Note   Patient: Tenika Keeran           Date of Birth: [birth year]           MRN: [id number] Visit Date: 08/08/2019              Requested by: Anne Ng, NP [address] [place],  [place] [address] PCP: Anne Ng, NP   Assessment & Plan: Visit Diagnoses:  1. Acute pain of left shoulder     Plan: Impression is chronic left shoulder pain suspect rotator cuff tendinopathy exacerbation.  Subacromial injection performed today.  Patient instructed to begin home exercises if she does notice improvement over the next couple weeks.  If not she will call back so that we can order an MRI to rule out rotator cuff tear.  Questions encouraged and answered.  Follow-Up Instructions: Return if symptoms worsen or fail to improve.   Orders:  Orders Placed This Encounter  Procedures  . Large Joint Inj: L subacromial bursa  . XR Shoulder Left   No orders of the defined types were placed in this encounter.     Procedures: Large Joint Inj: L subacromial bursa on 08/08/2019 3:10 PM Indications: pain Details: 22 G needle  Arthrogram: No  Medications: 3 mL lidocaine 1 %; 3 mL bupivacaine 0.5 %; 40 mg methylPREDNISolone acetate 40 MG/ML Outcome: tolerated well, no immediate complications Patient was prepped and draped in the usual sterile fashion.       Clinical Data: No additional findings.   Subjective: Chief Complaint  Patient presents with  . Left Shoulder - Pain    Ms. Marlett is a very pleasant 48 year old female comes in today for evaluation of left shoulder pain for the last 6 weeks.  Denies any definite injuries.  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  She cannot take NSAIDs due to history of gastric bypass surgery.  Denies any numbness or tingling or radicular symptoms.   Review of  Systems  Constitutional: Negative.   HENT: Negative.   Eyes: Negative.   Respiratory: Negative.   Cardiovascular: Negative.   Endocrine: Negative.   Musculoskeletal: Negative.   Neurological: Negative.   Hematological: Negative.   Psychiatric/Behavioral: Negative.   All other systems reviewed and are negative.    Objective: Vital Signs: Ht 5\' 2"  (1.575 m)   Wt 180 lb (81.6 kg)   BMI 32.92 kg/m   Physical Exam Vitals signs and nursing note reviewed.  Constitutional:      Appearance: She is well-developed.  Pulmonary:     Effort: Pulmonary effort is normal.  Skin:    General: Skin is warm.     Capillary Refill: Capillary refill takes less than 2 seconds.  Neurological:     Mental Status: She is alert and oriented to person, place, and time.  Psychiatric:        Behavior: Behavior normal.        Thought Content: Thought content normal.        Judgment: Judgment normal.     Ortho Exam Left shoulder exam shows moderate pain with extremes of range of motion.  Moderate pain with empty can testing.  No limitation in strength.  Positive impingement signs.  Positive cross body adduction.  Acromial processes moderately tender. Specialty Comments:  No specialty comments available.  Imaging:  Xr Shoulder Left  Result Date: 08/08/2019 Os acromiale.  Otherwise unremarkable.    PMFS History: Patient Active Problem List   Diagnosis Date Noted  . H/O gastric bypass 02/17/2017  . Excess skin of abdominal wall 02/17/2017  . Muscle cramps at night 02/17/2017  . DVT (deep venous thrombosis) ([place]) 08/26/2016  . Fibromyalgia 08/26/2016  . Primary osteoarthritis of left knee 08/26/2016   Past Medical History:  Diagnosis Date  . Allergy   . Arthritis   . Asthma   . GERD (gastroesophageal reflux disease)   . Hypertension     Family History  Problem Relation Age of Onset  . Cancer Mother 41       uterine  . Arthritis Mother   . Hypertension Mother   . Hyperlipidemia  Mother   . Diabetes Mother   . Antithrombin III deficiency Mother   . Hypertension Father   . Hyperlipidemia Father   . Diabetes Father   . Cancer Maternal Aunt 74       melanoma with mets to uterine, kidney, lung, brain  . Cancer Maternal Uncle 60       stomach  . Antithrombin III deficiency Maternal Uncle   . Arthritis Maternal Grandmother   . Cancer Maternal Grandmother 88       liver  . Arthritis Maternal Grandfather   . Arthritis Paternal Grandmother   . Arthritis Paternal Grandfather     Past Surgical History:  Procedure Laterality Date  . gastric bipass     done 2013  . MENISCUS REPAIR Bilateral    2006 and 2008  . roux n y  06/26/2012   gastric by pass   Social History   Occupational History  . Not on file  Tobacco Use  . Smoking status: Never Smoker  . Smokeless tobacco: Never Used  Substance and Sexual Activity  . Alcohol use: No  . Drug use: No  . Sexual activity: Yes    Birth control/protection: I.U.D.    Comment: mirena

## 2019-08-21 ENCOUNTER — Ambulatory Visit (INDEPENDENT_AMBULATORY_CARE_PROVIDER_SITE_OTHER): Payer: 59 | Admitting: Nurse Practitioner

## 2019-08-21 ENCOUNTER — Encounter: Payer: Self-pay | Admitting: Nurse Practitioner

## 2019-08-21 VITALS — BP 118/76 | HR 52 | Temp 98.3°F | Ht 62.0 in | Wt 184.0 lb

## 2019-08-21 DIAGNOSIS — J45909 Unspecified asthma, uncomplicated: Secondary | ICD-10-CM | POA: Insufficient documentation

## 2019-08-21 DIAGNOSIS — Z13220 Encounter for screening for lipoid disorders: Secondary | ICD-10-CM

## 2019-08-21 DIAGNOSIS — Z136 Encounter for screening for cardiovascular disorders: Secondary | ICD-10-CM

## 2019-08-21 DIAGNOSIS — Z1211 Encounter for screening for malignant neoplasm of colon: Secondary | ICD-10-CM

## 2019-08-21 DIAGNOSIS — Z0000 Encounter for general adult medical examination without abnormal findings: Secondary | ICD-10-CM

## 2019-08-21 LAB — LIPID PANEL
Cholesterol: 172 mg/dL (ref 0–200)
HDL: 61.2 mg/dL (ref >39.00)
LDL Cholesterol: 101 mg/dL — ABNORMAL HIGH (ref 0–99)
NonHDL: 110.57
Total CHOL/HDL Ratio: 3
Triglycerides: 50 mg/dL (ref 0.0–149.0)
VLDL: 10 mg/dL (ref 0.0–40.0)

## 2019-08-21 LAB — COMPREHENSIVE METABOLIC PANEL
ALT: 10 U/L (ref 0–35)
AST: 14 U/L (ref 0–37)
Albumin: 3.8 g/dL (ref 3.5–5.2)
Alkaline Phosphatase: 40 U/L (ref 39–117)
BUN: 15 mg/dL (ref 6–23)
CO2: 27 mEq/L (ref 19–32)
Calcium: 8.9 mg/dL (ref 8.4–10.5)
Chloride: 103 mEq/L (ref 96–112)
Creatinine, Ser: 0.57 mg/dL (ref 0.40–1.20)
GFR: 112.2 mL/min (ref >60.00)
Glucose, Bld: 84 mg/dL (ref 70–99)
Potassium: 3.4 mEq/L — ABNORMAL LOW (ref 3.5–5.1)
Sodium: 136 mEq/L (ref 135–145)
Total Bilirubin: 0.6 mg/dL (ref 0.2–1.2)
Total Protein: 6.8 g/dL (ref 6.0–8.3)

## 2019-08-21 LAB — TSH: TSH: 1.57 u[IU]/mL (ref 0.35–4.50)

## 2019-08-21 LAB — CBC
HCT: 33 % — ABNORMAL LOW (ref 36.0–46.0)
Hemoglobin: 10.7 g/dL — ABNORMAL LOW (ref 12.0–15.0)
MCHC: 32.3 g/dL (ref 30.0–36.0)
MCV: 83.8 fl (ref 78.0–100.0)
Platelets: 313 10*3/uL (ref 150.0–400.0)
RBC: 3.94 Mil/uL (ref 3.87–5.11)
RDW: 16.3 % — ABNORMAL HIGH (ref 11.5–15.5)
WBC: 6.8 10*3/uL (ref 4.0–10.5)

## 2019-08-21 NOTE — Patient Instructions (Addendum)
Go to lab for blood draw.  Resume heart healthy diet and regular exercise.   Health Maintenance, Female Adopting a healthy lifestyle and getting preventive care are important in promoting health and wellness. Ask your health care provider about:  The right schedule for you to have regular tests and exams.  Things you can do on your own to prevent diseases and keep yourself healthy. What should I know about diet, weight, and exercise? Eat a healthy diet   Eat a diet that includes plenty of vegetables, fruits, low-fat dairy products, and lean protein.  Do not eat a lot of foods that are high in solid fats, added sugars, or sodium. Maintain a healthy weight Body mass index (BMI) is used to identify weight problems. It estimates body fat based on height and weight. Your health care provider can help determine your BMI and help you achieve or maintain a healthy weight. Get regular exercise Get regular exercise. This is one of the most important things you can do for your health. Most adults should:  Exercise for at least 150 minutes each week. The exercise should increase your heart rate and make you sweat (moderate-intensity exercise).  Do strengthening exercises at least twice a week. This is in addition to the moderate-intensity exercise.  Spend less time sitting. Even light physical activity can be beneficial. Watch cholesterol and blood lipids Have your blood tested for lipids and cholesterol at 50 years of age, then have this test every 5 years. Have your cholesterol levels checked more often if:  Your lipid or cholesterol levels are high.  You are older than 50 years of age.  You are at high risk for heart disease. What should I know about cancer screening? Depending on your health history and family history, you may need to have cancer screening at various ages. This may include screening for:  Breast cancer.  Cervical cancer.  Colorectal cancer.  Skin cancer.  Lung  cancer. What should I know about heart disease, diabetes, and high blood pressure? Blood pressure and heart disease  High blood pressure causes heart disease and increases the risk of stroke. This is more likely to develop in people who have high blood pressure readings, are of African descent, or are overweight.  Have your blood pressure checked: ? Every 3-5 years if you are 70-39 years of age. ? Every year if you are 28 years old or older. Diabetes Have regular diabetes screenings. This checks your fasting blood sugar level. Have the screening done:  Once every three years after age 63 if you are at a normal weight and have a low risk for diabetes.  More often and at a younger age if you are overweight or have a high risk for diabetes. What should I know about preventing infection? Hepatitis B If you have a higher risk for hepatitis B, you should be screened for this virus. Talk with your health care provider to find out if you are at risk for hepatitis B infection. Hepatitis C Testing is recommended for:  Everyone born from [birth year] through 1967.  Anyone with known risk factors for hepatitis C. Sexually transmitted infections (STIs)  Get screened for STIs, including gonorrhea and chlamydia, if: ? You are sexually active and are younger than 50 years of age. ? You are older than 50 years of age and your health care provider tells you that you are at risk for this type of infection. ? Your sexual activity has changed since you were last screened,  and you are at increased risk for chlamydia or gonorrhea. Ask your health care provider if you are at risk.  Ask your health care provider about whether you are at high risk for HIV. Your health care provider may recommend a prescription medicine to help prevent HIV infection. If you choose to take medicine to prevent HIV, you should first get tested for HIV. You should then be tested every 3 months for as long as you are taking the medicine.  Pregnancy  If you are about to stop having your period (premenopausal) and you may become pregnant, seek counseling before you get pregnant.  Take 400 to 800 micrograms (mcg) of folic acid every day if you become pregnant.  Ask for birth control (contraception) if you want to prevent pregnancy. Osteoporosis and menopause Osteoporosis is a disease in which the bones lose minerals and strength with aging. This can result in bone fractures. If you are 80 years old or older, or if you are at risk for osteoporosis and fractures, ask your health care provider if you should:  Be screened for bone loss.  Take a calcium or vitamin D supplement to lower your risk of fractures.  Be given hormone replacement therapy (HRT) to treat symptoms of menopause. Follow these instructions at home: Lifestyle  Do not use any products that contain nicotine or tobacco, such as cigarettes, e-cigarettes, and chewing tobacco. If you need help quitting, ask your health care provider.  Do not use street drugs.  Do not share needles.  Ask your health care provider for help if you need support or information about quitting drugs. Alcohol use  Do not drink alcohol if: ? Your health care provider tells you not to drink. ? You are pregnant, may be pregnant, or are planning to become pregnant.  If you drink alcohol: ? Limit how much you use to 0-1 drink a day. ? Limit intake if you are breastfeeding.  Be aware of how much alcohol is in your drink. In the [place] one drink equals one 12 oz bottle of beer (355 mL), one 5 oz glass of wine (148 mL), or one 1 oz glass of hard liquor (44 mL). General instructions  Schedule regular health, dental, and eye exams.  Stay current with your vaccines.  Tell your health care provider if: ? You often feel depressed. ? You have ever been abused or do not feel safe at home. Summary  Adopting a healthy lifestyle and getting preventive care are important in promoting health  and wellness.  Follow your health care provider's instructions about healthy diet, exercising, and getting tested or screened for diseases.  Follow your health care provider's instructions on monitoring your cholesterol and blood pressure. This information is not intended to replace advice given to you by your health care provider. Make sure you discuss any questions you have with your health care provider. Document Released: 05/03/2011 Document Revised: 10/11/2018 Document Reviewed: 10/11/2018 Elsevier Patient Education  2020 [employer].

## 2019-08-21 NOTE — Progress Notes (Signed)
Subjective:    Patient ID: Elizabeth Thornton, female    DOB: [birth year], 48 y.o.   MRN: [id number]  Patient presents today for complete physical  HPI  Sexual History (orientation,birth control, marital status, STD):postmenopausal, sexually active, up to date with PAP, up to date with mammogram  Depression/Suicide: Depression screen [facility] 08/21/2019 08/15/2018 08/08/2017 02/17/2017  Decreased Interest 0 0 0 0  Down, Depressed, Hopeless 0 0 0 0  PHQ - 2 Score 0 0 0 0   Vision:up to date  Dental:up to date  Immunizations: (TDAP, Hep C screen, Pneumovax, Influenza, zoster)  Health Maintenance  Topic Date Due  . Flu Shot  06/02/2019  . Mammogram  06/27/2019  . Colon Cancer Screening  06/27/2019  . HIV Screening  08/20/2020*  . Pap Smear  02/18/2020  . Tetanus Vaccine  08/26/2026  *Topic was postponed. The date shown is not the original due date.   Diet:regular.  Weight:  Wt Readings from Last 3 Encounters:  08/21/19 184 lb (83.5 kg)  08/08/19 180 lb (81.6 kg)  05/28/19 173 lb (78.5 kg)   Exercise:limited due to work schedule  Fall Risk: Fall Risk  08/21/2019 08/15/2018 08/08/2017 02/17/2017  Falls in the past year? 0 No Yes No  Number falls in past yr: - - 1 -  Injury with Fall? - - Yes -   Advanced Directive: Advanced Directives 08/03/2016  Does Patient Have a Medical Advance Directive? No  Would patient like information on creating a medical advance directive? No - patient declined information     Medications and allergies reviewed with patient and updated if appropriate.  Patient Active Problem List   Diagnosis Date Noted  . Asthma 08/21/2019  . Lipodystrophy 02/18/2017  . H/O gastric bypass 02/17/2017  . Excess skin of abdominal wall 02/17/2017  . Muscle cramps at night 02/17/2017  . DVT (deep venous thrombosis) (HCC) 08/26/2016  . Primary osteoarthritis of left knee 08/26/2016  . Fibromyalgia 10/20/2012  . Bilateral bunions 10/20/2012  . Generalized  osteoarthrosis 06/23/2012  . Vitamin D deficiency 06/23/2012    Current Outpatient Medications on File Prior to Visit  Medication Sig Dispense Refill  . acetaminophen (TYLENOL) 500 MG tablet Take 1 tablet (500 mg total) by mouth every 8 (eight) hours as needed. 30 tablet 0  . Albuterol Sulfate (PROAIR RESPICLICK) 108 (90 Base) MCG/ACT AEPB Inhale 1-2 puffs into the lungs every 6 (six) hours as needed. 1 each 0  . beclomethasone (QVAR) 40 MCG/ACT inhaler Inhale 2 puffs into the lungs 2 (two) times daily. Rinse mouth after each use 1 Inhaler 1  . Cyanocobalamin (VITAMIN B-12 CR) 1000 MCG TBCR Take 2,500 mcg by mouth 2 (two) times daily.     . multivitamin-iron-minerals-folic acid (CENTRUM) chewable tablet Frequency:BID   Dosage:0.0     Instructions:  Note:Dose: 1    . omeprazole (PRILOSEC) 20 MG capsule Take 20 mg by mouth daily.    . Probiotic Product (PROBIOTIC DAILY PO) Take by mouth.    . calcium citrate-vitamin D (CITRACAL+D) 315-200 MG-UNIT per tablet Take by mouth.    . Cholecalciferol (VITAMIN D-1000 MAX ST) 1000 units tablet Take 1,000 Units by mouth 1 day or 1 dose.    Marland Kitchen. OVER THE COUNTER MEDICATION Acocado/Soyunsaponifiables 300 mg 1 tab by mouth daily. OTC for joints.    . Sodium Hyaluronate (EUFLEXXA) 20 MG/2ML SOSY Inject 10 mg as directed.    . topiramate (TOPAMAX) 50 MG tablet Take 50 mg by mouth 1 day  or 1 dose.     No current facility-administered medications on file prior to visit.     Past Medical History:  Diagnosis Date  . Allergy   . Arthritis   . Asthma   . GERD (gastroesophageal reflux disease)   . Hypertension     Past Surgical History:  Procedure Laterality Date  . gastric bipass     done 2013  . MENISCUS REPAIR Bilateral    2006 and 2008  . roux n y  06/26/2012   gastric by pass    Social History   Socioeconomic History  . Marital status: Single    Spouse name: Not on file  . Number of children: Not on file  . Years of education: Not on file  .  Highest education level: Not on file  Occupational History  . Not on file  Social Needs  . Financial resource strain: Not on file  . Food insecurity    Worry: Not on file    Inability: Not on file  . Transportation needs    Medical: Not on file    Non-medical: Not on file  Tobacco Use  . Smoking status: Never Smoker  . Smokeless tobacco: Never Used  Substance and Sexual Activity  . Alcohol use: No  . Drug use: No  . Sexual activity: Yes    Birth control/protection: I.U.D.    Comment: mirena  Lifestyle  . Physical activity    Days per week: Not on file    Minutes per session: Not on file  . Stress: Not on file  Relationships  . Social Herbalist on phone: Not on file    Gets together: Not on file    Attends religious service: Not on file    Active member of club or organization: Not on file    Attends meetings of clubs or organizations: Not on file    Relationship status: Not on file  Other Topics Concern  . Not on file  Social History Narrative  . Not on file    Family History  Problem Relation Age of Onset  . Cancer Mother 78       uterine  . Arthritis Mother   . Hypertension Mother   . Hyperlipidemia Mother   . Diabetes Mother   . Antithrombin III deficiency Mother   . Hypertension Father   . Hyperlipidemia Father   . Diabetes Father   . Cancer Maternal Aunt 74       melanoma with mets to uterine, kidney, lung, brain  . Cancer Maternal Uncle 60       stomach  . Antithrombin III deficiency Maternal Uncle   . Arthritis Maternal Grandmother   . Cancer Maternal Grandmother 73       liver  . Arthritis Maternal Grandfather   . Arthritis Paternal Grandmother   . Arthritis Paternal Grandfather        Review of Systems  Constitutional: Negative for fever, malaise/fatigue and weight loss.  HENT: Negative for congestion and sore throat.   Eyes:       Negative for visual changes  Respiratory: Negative for cough and shortness of breath.    Cardiovascular: Negative for chest pain, palpitations and leg swelling.  Gastrointestinal: Negative for blood in stool, constipation, diarrhea and heartburn.  Genitourinary: Negative for dysuria, frequency and urgency.  Musculoskeletal: Negative for falls, joint pain and myalgias.  Skin: Negative for rash.  Neurological: Negative for dizziness, sensory change and headaches.  Endo/Heme/Allergies: Does  not bruise/bleed easily.  Psychiatric/Behavioral: Negative for depression, substance abuse and suicidal ideas. The patient is not nervous/anxious.     Objective:   Vitals:   08/21/19 0924  BP: 118/76  Pulse: (!) 52  Temp: 98.3 F (36.8 C)  SpO2: 99%    Body mass index is 33.65 kg/m.   Physical Examination:  Physical Exam Vitals signs reviewed.  Constitutional:      General: She is not in acute distress.    Appearance: She is well-developed.  HENT:     Right Ear: Tympanic membrane, ear canal and external ear normal.     Left Ear: Tympanic membrane, ear canal and external ear normal.     Nose: Nose normal.  Eyes:     Extraocular Movements: Extraocular movements intact.     Conjunctiva/sclera: Conjunctivae normal.  Cardiovascular:     Rate and Rhythm: Normal rate and regular rhythm.     Heart sounds: Normal heart sounds.  Pulmonary:     Effort: Pulmonary effort is normal. No respiratory distress.     Breath sounds: Normal breath sounds.  Chest:     Chest wall: No tenderness.  Genitourinary:    Comments: Deferred breast and pelvic exam to GYN Musculoskeletal: Normal range of motion.  Neurological:     Mental Status: She is alert and oriented to person, place, and time.     Deep Tendon Reflexes: Reflexes are normal and symmetric.     ASSESSMENT and PLAN:  Gailyn was seen today for annual exam.  Diagnoses and all orders for this visit:  Preventative health care -     CBC -     Comprehensive metabolic panel -     TSH  Encounter for lipid screening for  cardiovascular disease -     Lipid panel  Colon cancer screening -     Ambulatory referral to Gastroenterology   No problem-specific Assessment & Plan notes found for this encounter.     Problem List Items Addressed This Visit    None    Visit Diagnoses    Preventative health care    -  Primary   Relevant Orders   CBC (Completed)   Comprehensive metabolic panel (Completed)   TSH (Completed)   Encounter for lipid screening for cardiovascular disease       Relevant Orders   Lipid panel (Completed)   Colon cancer screening       Relevant Orders   Ambulatory referral to Gastroenterology       Follow up: Return in about 1 year (around 08/20/2020) for CPE (fasting).  Alysia Penna, NP

## 2019-08-23 ENCOUNTER — Encounter: Payer: Self-pay | Admitting: Nurse Practitioner

## 2019-09-05 ENCOUNTER — Other Ambulatory Visit: Payer: Self-pay

## 2019-09-05 ENCOUNTER — Ambulatory Visit (INDEPENDENT_AMBULATORY_CARE_PROVIDER_SITE_OTHER): Payer: 59 | Admitting: Behavioral Health

## 2019-09-05 ENCOUNTER — Encounter: Payer: Self-pay | Admitting: Nurse Practitioner

## 2019-09-05 DIAGNOSIS — Z23 Encounter for immunization: Secondary | ICD-10-CM | POA: Diagnosis not present

## 2019-09-05 NOTE — Progress Notes (Signed)
Patient presents in clinic today for Influenza vaccination. IM injection was given in the right deltoid. Patient tolerated the injection well. No signs or symptoms of a reaction were noted prior to patient leaving the nurse visit. 

## 2019-10-03 ENCOUNTER — Encounter: Payer: Self-pay | Admitting: Nurse Practitioner

## 2019-10-08 NOTE — Telephone Encounter (Signed)
Ok to order cologuard

## 2019-10-14 ENCOUNTER — Encounter: Payer: Self-pay | Admitting: Nurse Practitioner

## 2019-10-14 DIAGNOSIS — K219 Gastro-esophageal reflux disease without esophagitis: Secondary | ICD-10-CM

## 2019-10-16 ENCOUNTER — Encounter: Payer: Self-pay | Admitting: Nurse Practitioner

## 2019-10-16 DIAGNOSIS — K219 Gastro-esophageal reflux disease without esophagitis: Secondary | ICD-10-CM | POA: Insufficient documentation

## 2019-10-16 MED ORDER — OMEPRAZOLE 20 MG PO CPDR: 20.0000 mg | DELAYED_RELEASE_CAPSULE | Freq: Every day | ORAL | 1 refills | Status: DC

## 2019-10-16 NOTE — Progress Notes (Signed)
Abstracted sent to be scanned. 

## 2019-10-19 LAB — COLOGUARD: Cologuard: NEGATIVE

## 2019-10-22 ENCOUNTER — Encounter: Payer: Self-pay | Admitting: Nurse Practitioner

## 2019-10-22 DIAGNOSIS — R252 Cramp and spasm: Secondary | ICD-10-CM

## 2019-10-22 NOTE — Telephone Encounter (Signed)
It looks like the last time she was seen for this was 11/2017.  I would recommend visit to evaluate and discuss.

## 2019-10-23 MED ORDER — METHOCARBAMOL 500 MG PO TABS: 500.0000 mg | ORAL_TABLET | Freq: Every evening | ORAL | 0 refills | Status: DC | PRN

## 2019-10-24 ENCOUNTER — Encounter: Payer: Self-pay | Admitting: Nurse Practitioner

## 2019-10-24 NOTE — Progress Notes (Unsigned)
Abstracted sent to be scanned into chat.

## 2019-11-07 ENCOUNTER — Encounter: Payer: Self-pay | Admitting: Nurse Practitioner

## 2019-11-07 NOTE — Telephone Encounter (Signed)
ok 

## 2020-01-03 ENCOUNTER — Encounter: Payer: Self-pay | Admitting: Nurse Practitioner

## 2020-01-03 NOTE — Telephone Encounter (Signed)
Elizabeth Thornton FYI-- 

## 2020-03-25 ENCOUNTER — Other Ambulatory Visit: Payer: Self-pay | Admitting: Nurse Practitioner

## 2020-03-25 DIAGNOSIS — K219 Gastro-esophageal reflux disease without esophagitis: Secondary | ICD-10-CM

## 2020-05-19 ENCOUNTER — Encounter: Payer: Self-pay | Admitting: Internal Medicine

## 2020-05-28 ENCOUNTER — Encounter: Payer: Self-pay | Admitting: Nurse Practitioner

## 2020-05-28 ENCOUNTER — Ambulatory Visit: Payer: 59 | Admitting: Nurse Practitioner

## 2020-05-28 ENCOUNTER — Other Ambulatory Visit: Payer: Self-pay

## 2020-05-28 VITALS — BP 110/78 | HR 55 | Temp 98.6°F | Ht 62.0 in | Wt 186.4 lb

## 2020-05-28 DIAGNOSIS — R635 Abnormal weight gain: Secondary | ICD-10-CM | POA: Diagnosis not present

## 2020-05-28 DIAGNOSIS — R5381 Other malaise: Secondary | ICD-10-CM | POA: Diagnosis not present

## 2020-05-28 DIAGNOSIS — E349 Endocrine disorder, unspecified: Secondary | ICD-10-CM | POA: Diagnosis not present

## 2020-05-28 DIAGNOSIS — R5383 Other fatigue: Secondary | ICD-10-CM

## 2020-05-28 LAB — COMPREHENSIVE METABOLIC PANEL
ALT: 11 U/L (ref 0–35)
AST: 15 U/L (ref 0–37)
Albumin: 4.1 g/dL (ref 3.5–5.2)
Alkaline Phosphatase: 51 U/L (ref 39–117)
BUN: 14 mg/dL (ref 6–23)
CO2: 29 mEq/L (ref 19–32)
Calcium: 9.5 mg/dL (ref 8.4–10.5)
Chloride: 102 mEq/L (ref 96–112)
Creatinine, Ser: 0.7 mg/dL (ref 0.40–1.20)
GFR: 88.25 mL/min (ref >60.00)
Glucose, Bld: 75 mg/dL (ref 70–99)
Potassium: 4.4 mEq/L (ref 3.5–5.1)
Sodium: 136 mEq/L (ref 135–145)
Total Bilirubin: 0.5 mg/dL (ref 0.2–1.2)
Total Protein: 7.1 g/dL (ref 6.0–8.3)

## 2020-05-28 LAB — CBC WITH DIFFERENTIAL/PLATELET
Basophils Absolute: 0 10*3/uL (ref 0.0–0.1)
Basophils Relative: 0.8 % (ref 0.0–3.0)
Eosinophils Absolute: 0.1 10*3/uL (ref 0.0–0.7)
Eosinophils Relative: 1 % (ref 0.0–5.0)
HCT: 32.4 % — ABNORMAL LOW (ref 36.0–46.0)
Hemoglobin: 10.1 g/dL — ABNORMAL LOW (ref 12.0–15.0)
Lymphocytes Relative: 27.6 % (ref 12.0–46.0)
Lymphs Abs: 1.6 10*3/uL (ref 0.7–4.0)
MCHC: 31.2 g/dL (ref 30.0–36.0)
MCV: 78.3 fl (ref 78.0–100.0)
Monocytes Absolute: 0.6 10*3/uL (ref 0.1–1.0)
Monocytes Relative: 10.1 % (ref 3.0–12.0)
Neutro Abs: 3.4 10*3/uL (ref 1.4–7.7)
Neutrophils Relative %: 60.5 % (ref 43.0–77.0)
Platelets: 384 10*3/uL (ref 150.0–400.0)
RBC: 4.14 Mil/uL (ref 3.87–5.11)
RDW: 17.2 % — ABNORMAL HIGH (ref 11.5–15.5)
WBC: 5.6 10*3/uL (ref 4.0–10.5)

## 2020-05-28 LAB — CK: Total CK: 57 U/L (ref 7–177)

## 2020-05-28 LAB — SEDIMENTATION RATE: Sed Rate: 46 mm/hr — ABNORMAL HIGH (ref 0–30)

## 2020-05-28 LAB — C-REACTIVE PROTEIN: CRP: <1 mg/dL (ref 0.5–20.0)

## 2020-05-28 LAB — BRAIN NATRIURETIC PEPTIDE: Pro B Natriuretic peptide (BNP): 49 pg/mL (ref 0.0–100.0)

## 2020-05-28 NOTE — Progress Notes (Signed)
Subjective:  Patient ID: Elizabeth Thornton, female    DOB: [birth year]  Age: 49 y.o. MRN: [id number]  CC: Weight Gain (swelling in legs, weight gain, no period pateint states that she is still numb in lumph nodes, poor appetite. Symptoms going on since April after surgery. ) and Pain (pain in legs where patient had drains after sugery. )  HPI   BP Readings from Last 3 Encounters:  05/28/20 110/78  08/21/19 118/76  05/28/19 (!) 112/58   Wt Readings from Last 3 Encounters:  05/28/20 186 lb 6.4 oz (84.6 kg)  08/21/19 184 lb (83.5 kg)  08/08/19 180 lb (81.6 kg)   Abnormal weight gain Experiencing generalized malaise, and LE neuropathy with continuous weight gain. No CP or cough, SOB with exertion, PND, or dizziness. Has Mirena IUD in place. Labs from GYN: Thyroid panel (TSH 3.320, t4 5.6, T3 25), FSH (5.7) and estradiol (124). Had surgery on 02/14/2020 (removal on excess skin) by [facility] plastic surgeon. Last OV with surgeon 05/23/20, documented weight of 190lbs. Negative venous doppler on 05/23/2020. Documented Weight before surgery 177lbs (12/2019) Documented weight 2weeks after plastic surgery was 173lbs.  No LE edema appreciated during exam, clear lungs, normal rhythm. Check CBC, CMP, BNP, CK, ESR, PTH and CRP. Stable labs except elevated parathyroid hormone. I recommend referral to endocrinology instead of lymphedema clinic. Order entered   Reviewed past Medical, Social and Family history today.  Outpatient Medications Prior to Visit  Medication Sig Dispense Refill  . acetaminophen (TYLENOL) 500 MG tablet Take 1 tablet (500 mg total) by mouth every 8 (eight) hours as needed. 30 tablet 0  . Albuterol Sulfate (PROAIR RESPICLICK) 881 (90 Base) MCG/ACT AEPB Inhale 1-2 puffs into the lungs every 6 (six) hours as needed. 1 each 0  . beclomethasone (QVAR) 40 MCG/ACT inhaler Inhale 2 puffs into the lungs 2 (two) times daily. Rinse mouth after each use 1 Inhaler 1  . calcium citrate-vitamin  D (CITRACAL+D) 315-200 MG-UNIT per tablet Take by mouth.    . Cholecalciferol (VITAMIN D-1000 MAX ST) 1000 units tablet Take 1,000 Units by mouth 1 day or 1 dose.    . Cyanocobalamin (VITAMIN B-12 CR) 1000 MCG TBCR Take 2,500 mcg by mouth 2 (two) times daily.     . cyclobenzaprine (FLEXERIL) 10 MG tablet Take 10 mg by mouth 3 (three) times daily as needed.    . gabapentin (NEURONTIN) 100 MG capsule Take 200 mg by mouth at bedtime.    Marland Kitchen levonorgestrel (MIRENA, 52 MG,) 20 MCG/24HR IUD Mirena 20 mcg/24 hours (6 yrs) 52 mg intrauterine device  Take 1 device by intrauterine route.    . methocarbamol (ROBAXIN) 500 MG tablet Take 1 tablet (500 mg total) by mouth at bedtime as needed for muscle spasms. 7 tablet 0  . multivitamin-iron-minerals-folic acid (CENTRUM) chewable tablet Frequency:BID   Dosage:0.0     Instructions:  Note:Dose: 1    . omeprazole (PRILOSEC) 20 MG capsule TAKE 1 CAPSULE DAILY 90 capsule 3  . Probiotic Product (PROBIOTIC DAILY PO) Take by mouth.    . Sodium Hyaluronate (EUFLEXXA) 20 MG/2ML SOSY Inject 10 mg as directed.    . topiramate (TOPAMAX) 50 MG tablet Take 50 mg by mouth 1 day or 1 dose.    Marland Kitchen OVER THE COUNTER MEDICATION Acocado/Soyunsaponifiables 300 mg 1 tab by mouth daily. OTC for joints.     No facility-administered medications prior to visit.    ROS See HPI  Objective:  BP 110/78   Pulse 55  Temp 98.6 F (37 C) (Tympanic)   Ht 5' 2"  (1.575 m)   Wt 186 lb 6.4 oz (84.6 kg)   SpO2 98%   BMI 34.09 kg/m   Physical Exam Vitals reviewed.  Constitutional:      Appearance: She is obese.  Cardiovascular:     Rate and Rhythm: Normal rate and regular rhythm.     Pulses: Normal pulses.     Heart sounds: Normal heart sounds.     Comments: No JVD Pulmonary:     Effort: Pulmonary effort is normal.     Breath sounds: Normal breath sounds.  Abdominal:     General: Bowel sounds are normal. There is no distension.     Palpations: Abdomen is soft.  Musculoskeletal:       Right lower leg: No edema.     Left lower leg: No edema.  Neurological:     Mental Status: She is alert and oriented to person, place, and time.    Assessment & Plan:  This visit occurred during the SARS-CoV-2 public health emergency.  Safety protocols were in place, including screening questions prior to the visit, additional usage of staff PPE, and extensive cleaning of exam room while observing appropriate contact time as indicated for disinfecting solutions.   Elizabeth Thornton was seen today for weight gain and pain.  Diagnoses and all orders for this visit:  Abnormal weight gain -     Cancel: Comprehensive metabolic panel -     PTH, intact (no Ca) -     CBC w/Diff -     CK (Creatine Kinase) -     Sedimentation rate -     C-reactive protein -     B Nat Peptide -     Comprehensive metabolic panel -     Ambulatory referral to Endocrinology  Malaise and fatigue -     Cancel: Comprehensive metabolic panel -     PTH, intact (no Ca) -     CBC w/Diff -     CK (Creatine Kinase) -     Sedimentation rate -     C-reactive protein -     B Nat Peptide -     Comprehensive metabolic panel  Elevated parathyroid hormone -     Ambulatory referral to Endocrinology    Problem List Items Addressed This Visit      Other   Abnormal weight gain - Primary    Experiencing generalized malaise, and LE neuropathy with continuous weight gain. No CP or cough, SOB with exertion, PND, or dizziness. Has Mirena IUD in place. Labs from GYN: Thyroid panel (TSH 3.320, t4 5.6, T3 25), FSH (5.7) and estradiol (124). Had surgery on 02/14/2020 (removal on excess skin) by [facility] plastic surgeon. Last OV with surgeon 05/23/20, documented weight of 190lbs. Negative venous doppler on 05/23/2020. Documented Weight before surgery 177lbs (12/2019) Documented weight 2weeks after plastic surgery was 173lbs.  No LE edema appreciated during exam, clear lungs, normal rhythm. Check CBC, CMP, BNP, CK, ESR, PTH and  CRP. Stable labs except elevated parathyroid hormone. I recommend referral to endocrinology instead of lymphedema clinic. Order entered       Relevant Orders   PTH, intact (no Ca) (Completed)   CBC w/Diff (Completed)   CK (Creatine Kinase) (Completed)   Sedimentation rate (Completed)   C-reactive protein (Completed)   B Nat Peptide (Completed)   Comprehensive metabolic panel (Completed)   Ambulatory referral to Endocrinology    Other Visit Diagnoses    Malaise  and fatigue       Relevant Orders   PTH, intact (no Ca) (Completed)   CBC w/Diff (Completed)   CK (Creatine Kinase) (Completed)   Sedimentation rate (Completed)   C-reactive protein (Completed)   B Nat Peptide (Completed)   Comprehensive metabolic panel (Completed)   Elevated parathyroid hormone       Relevant Orders   Ambulatory referral to Endocrinology      I have spent 37mns with this patient regarding history taking, documentation, [facility] and [facility] labs, formulating plan and discussing treatment options with patient.  Follow-up: Return if symptoms worsen or fail to improve.  CWilfred Lacy NP

## 2020-05-28 NOTE — Patient Instructions (Signed)
Go to lab for blood draw Continue thigh high compression stocking.  You will be contacted to schedule appt with lymphedema clinic

## 2020-05-28 NOTE — Assessment & Plan Note (Addendum)
Experiencing generalized malaise, and LE neuropathy with continuous weight gain. No CP or cough, SOB with exertion, PND, or dizziness. Has Mirena IUD in place. Labs from GYN: Thyroid panel (TSH 3.320, t4 5.6, T3 25), FSH (5.7) and estradiol (124). Had surgery on 02/14/2020 (removal on excess skin) by [facility] plastic surgeon. Last OV with surgeon 05/23/20, documented weight of 190lbs. Negative venous doppler on 05/23/2020. Documented Weight before surgery 177lbs (12/2019) Documented weight 2weeks after plastic surgery was 173lbs.  No LE edema appreciated during exam, clear lungs, normal rhythm. Check CBC, CMP, BNP, CK, ESR, PTH and CRP. Stable labs except elevated parathyroid hormone. I recommend referral to endocrinology instead of lymphedema clinic. Order entered

## 2020-05-29 ENCOUNTER — Ambulatory Visit: Payer: 59 | Admitting: Nurse Practitioner

## 2020-05-29 LAB — PARATHYROID HORMONE, INTACT (NO CA): PTH: 71 pg/mL — ABNORMAL HIGH (ref 14–64)

## 2020-05-30 ENCOUNTER — Encounter: Payer: Self-pay | Admitting: Nurse Practitioner

## 2020-06-17 ENCOUNTER — Encounter: Payer: Self-pay | Admitting: Nurse Practitioner

## 2020-07-21 ENCOUNTER — Ambulatory Visit: Payer: 59 | Admitting: Endocrinology

## 2020-07-29 ENCOUNTER — Encounter: Payer: Self-pay | Admitting: Endocrinology

## 2020-12-19 ENCOUNTER — Encounter: Payer: Self-pay | Admitting: Nurse Practitioner

## 2021-01-06 LAB — HM MAMMOGRAPHY

## 2021-03-16 ENCOUNTER — Other Ambulatory Visit: Payer: Self-pay | Admitting: Nurse Practitioner

## 2021-03-16 DIAGNOSIS — K219 Gastro-esophageal reflux disease without esophagitis: Secondary | ICD-10-CM

## 2021-03-17 ENCOUNTER — Encounter: Payer: Self-pay | Admitting: Nurse Practitioner

## 2021-04-29 ENCOUNTER — Encounter: Payer: Self-pay | Admitting: Nurse Practitioner

## 2021-09-30 ENCOUNTER — Other Ambulatory Visit: Payer: Self-pay

## 2021-09-30 ENCOUNTER — Ambulatory Visit (INDEPENDENT_AMBULATORY_CARE_PROVIDER_SITE_OTHER): Payer: 59 | Admitting: Nurse Practitioner

## 2021-09-30 ENCOUNTER — Encounter: Payer: Self-pay | Admitting: Nurse Practitioner

## 2021-09-30 VITALS — BP 104/62 | HR 70 | Temp 97.2°F | Ht 63.75 in | Wt 187.2 lb

## 2021-09-30 DIAGNOSIS — Z0001 Encounter for general adult medical examination with abnormal findings: Secondary | ICD-10-CM | POA: Diagnosis not present

## 2021-09-30 DIAGNOSIS — R071 Chest pain on breathing: Secondary | ICD-10-CM | POA: Diagnosis not present

## 2021-09-30 DIAGNOSIS — Z13220 Encounter for screening for lipoid disorders: Secondary | ICD-10-CM

## 2021-09-30 DIAGNOSIS — Z136 Encounter for screening for cardiovascular disorders: Secondary | ICD-10-CM

## 2021-09-30 DIAGNOSIS — M546 Pain in thoracic spine: Secondary | ICD-10-CM | POA: Diagnosis not present

## 2021-09-30 DIAGNOSIS — E559 Vitamin D deficiency, unspecified: Secondary | ICD-10-CM | POA: Diagnosis not present

## 2021-09-30 DIAGNOSIS — Z23 Encounter for immunization: Secondary | ICD-10-CM

## 2021-09-30 DIAGNOSIS — R7989 Other specified abnormal findings of blood chemistry: Secondary | ICD-10-CM

## 2021-09-30 NOTE — Patient Instructions (Addendum)
Sign medical release to get records from GYN.  Go to lab for blood draw and x-ray  Stop use of bicycle at this time Ok to alternate between warm and cold compress as needed. Ok to use tylenol 500-632m every 8hrs as needed.  Schedule nurse visit for 2nd shingrix vaccine in 216month  Preventive Care [id number] Old, Female Preventive care refers to lifestyle choices and visits with your health care provider that can promote health and wellness. Preventive care visits are also called wellness exams. What can I expect for my preventive care visit? Counseling Your health care provider may ask you questions about your: Medical history, including: Past medical problems. Family medical history. Pregnancy history. Current health, including: Menstrual cycle. Method of birth control. Emotional well-being. Home life and relationship well-being. Sexual activity and sexual health. Lifestyle, including: Alcohol, nicotine or tobacco, and drug use. Access to firearms. Diet, exercise, and sleep habits. Work and work enStatisticianSunscreen use. Safety issues such as seatbelt and bike helmet use. Physical exam Your health care provider will check your: Height and weight. These may be used to calculate your BMI (body mass index). BMI is a measurement that tells if you are at a healthy weight. Waist circumference. This measures the distance around your waistline. This measurement also tells if you are at a healthy weight and may help predict your risk of certain diseases, such as type 2 diabetes and high blood pressure. Heart rate and blood pressure. Body temperature. Skin for abnormal spots. What immunizations do I need? Vaccines are usually given at various ages, according to a schedule. Your health care provider will recommend vaccines for you based on your age, medical history, and lifestyle or other factors, such as travel or where you work. What tests do I need? Screening Your health care  provider may recommend screening tests for certain conditions. This may include: Lipid and cholesterol levels. Diabetes screening. This is done by checking your blood sugar (glucose) after you have not eaten for a while (fasting). Pelvic exam and Pap test. Hepatitis B test. Hepatitis C test. HIV (human immunodeficiency virus) test. STI (sexually transmitted infection) testing, if you are at risk. Lung cancer screening. Colorectal cancer screening. Mammogram. Talk with your health care provider about when you should start having regular mammograms. This may depend on whether you have a family history of breast cancer. BRCA-related cancer screening. This may be done if you have a family history of breast, ovarian, tubal, or peritoneal cancers. Bone density scan. This is done to screen for osteoporosis. Talk with your health care provider about your test results, treatment options, and if necessary, the need for more tests. Follow these instructions at home: Eating and drinking  Eat a diet that includes fresh fruits and vegetables, whole grains, lean protein, and low-fat dairy products. Take vitamin and mineral supplements as recommended by your health care provider. Do not drink alcohol if: Your health care provider tells you not to drink. You are pregnant, may be pregnant, or are planning to become pregnant. If you drink alcohol: Limit how much you have to 0-1 drink a day. Know how much alcohol is in your drink. In the [place] one drink equals one 12 oz bottle of beer (355 mL), one 5 oz glass of wine (148 mL), or one 1 oz glass of hard liquor (44 mL). Lifestyle Brush your teeth every morning and night with fluoride toothpaste. Floss one time each day. Exercise for at least 30 minutes 5 or more days each  week. Do not use any products that contain nicotine or tobacco. These products include cigarettes, chewing tobacco, and vaping devices, such as e-cigarettes. If you need help quitting, ask  your health care provider. Do not use drugs. If you are sexually active, practice safe sex. Use a condom or other form of protection to prevent STIs. If you do not wish to become pregnant, use a form of birth control. If you plan to become pregnant, see your health care provider for a prepregnancy visit. Take aspirin only as told by your health care provider. Make sure that you understand how much to take and what form to take. Work with your health care provider to find out whether it is safe and beneficial for you to take aspirin daily. Find healthy ways to manage stress, such as: Meditation, yoga, or listening to music. Journaling. Talking to a trusted person. Spending time with friends and family. Minimize exposure to UV radiation to reduce your risk of skin cancer. Safety Always wear your seat belt while driving or riding in a vehicle. Do not drive: If you have been drinking alcohol. Do not ride with someone who has been drinking. When you are tired or distracted. While texting. If you have been using any mind-altering substances or drugs. Wear a helmet and other protective equipment during sports activities. If you have firearms in your house, make sure you follow all gun safety procedures. Seek help if you have been physically or sexually abused. What's next? Visit your health care provider once a year for an annual wellness visit. Ask your health care provider how often you should have your eyes and teeth checked. Stay up to date on all vaccines. This information is not intended to replace advice given to you by your health care provider. Make sure you discuss any questions you have with your health care provider. Document Revised: 04/15/2021 Document Reviewed: 04/15/2021 Elsevier Patient Education  [place].

## 2021-09-30 NOTE — Progress Notes (Signed)
Subjective:    Patient ID: Elizabeth Thornton, female    DOB: 03-16-1971, 50 y.o.   MRN: [id number]  Patient presents today for CPE and eval of acute condition  Back Pain This is a new problem. The current episode started 1 to 4 weeks ago (2weeks ago). The problem occurs constantly. The problem has been gradually worsening since onset. The pain is present in the thoracic spine. The quality of the pain is described as aching, burning and cramping. Radiates to: to chest. The pain is moderate. The pain is The same all the time. The symptoms are aggravated by bending, lying down and coughing. Pertinent negatives include no abdominal pain, bladder incontinence, bowel incontinence, chest pain, dysuria, fever, headaches, leg pain, numbness, paresis, tingling, weakness or weight loss. Risk factors include obesity, poor posture and menopause. She has tried muscle relaxant, heat and ice (tylenol) for the symptoms. The treatment provided no relief.  Unable to take NSAIDs due to gastric bypass.  Denies any recent URI or chest/back injury.  Vision:up to date Dental:will schedule Diet:heart healthy Exercise:stationery bicycle daily Weight:  Wt Readings from Last 3 Encounters:  09/30/21 187 lb 3.2 oz (84.9 kg)  05/28/20 186 lb 6.4 oz (84.6 kg)  08/21/19 184 lb (83.5 kg)    Sexual History (orientation,birth control, marital status, STD):no Std screen, mammogram and PAP completed by Dr. Corinna Capra 06/2021. IUD is place. Up to date with colon cancer screen: cologuard completed 2020 (normal)  Depression/Suicide: Depression screen [facility] 2/9 09/30/2021 08/21/2019 08/15/2018 08/08/2017 02/17/2017  Decreased Interest 0 0 0 0 0  Down, Depressed, Hopeless 0 0 0 0 0  PHQ - 2 Score 0 0 0 0 0   Immunizations: (TDAP, Hep C screen, Pneumovax, Influenza, zoster)  Health Maintenance  Topic Date Due   Pap Smear  02/18/2020   Mammogram  06/20/2021   Flu Shot  01/29/2022*   Pneumococcal Vaccination (1 - PCV) 09/30/2022*    Hepatitis C Screening: USPSTF Recommendation to screen - Ages 18-79 yo.  09/30/2022*   HIV Screening  09/30/2022*   Zoster (Shingles) Vaccine (2 of 2) 11/25/2021   Cologuard (Stool DNA test)  10/18/2022   Tetanus Vaccine  08/26/2026   HPV Vaccine  Aged Out   COVID-19 Vaccine  Discontinued  *Topic was postponed. The date shown is not the original due date.   Fall Risk: Fall Risk  09/30/2021 08/21/2019 08/15/2018 08/08/2017 02/17/2017  Falls in the past year? 0 0 No Yes No  Number falls in past yr: 0 - - 1 -  Injury with Fall? 0 - - Yes -  Risk for fall due to : No Fall Risks - - - -  Follow up Falls evaluation completed - - - -   Medications and allergies reviewed with patient and updated if appropriate.  Patient Active Problem List   Diagnosis Date Noted   Abnormal weight gain 05/28/2020   GERD (gastroesophageal reflux disease) 10/16/2019   Asthma 08/21/2019   Lipodystrophy 02/18/2017   H/O gastric bypass 02/17/2017   Excess skin of abdominal wall 02/17/2017   Muscle cramps at night 02/17/2017   History of DVT of lower extremity 08/26/2016   Primary osteoarthritis of left knee 08/26/2016   Fibromyalgia 10/20/2012   Bilateral bunions 10/20/2012   Generalized osteoarthrosis 06/23/2012   Vitamin D deficiency 06/23/2012    Current Outpatient Medications on File Prior to Visit  Medication Sig Dispense Refill   acetaminophen (TYLENOL) 500 MG tablet Take 1 tablet (500 mg total) by mouth  every 8 (eight) hours as needed. 30 tablet 0   Albuterol Sulfate (PROAIR RESPICLICK) 003 (90 Base) MCG/ACT AEPB Inhale 1-2 puffs into the lungs every 6 (six) hours as needed. 1 each 0   calcium citrate-vitamin D (CITRACAL+D) 315-200 MG-UNIT per tablet Take by mouth.     Cholecalciferol 25 MCG (1000 UT) tablet Take 1,000 Units by mouth 1 day or 1 dose.     Cyanocobalamin (VITAMIN B-12 CR) 1000 MCG TBCR Take 2,500 mcg by mouth 2 (two) times daily.      cyclobenzaprine (FLEXERIL) 10 MG tablet Take 10 mg  by mouth 3 (three) times daily as needed.     FLUoxetine (PROZAC) 20 MG capsule Take 20 mg by mouth daily at 12 noon.     levonorgestrel (MIRENA, 52 MG,) 20 MCG/24HR IUD Mirena 20 mcg/24 hours (6 yrs) 52 mg intrauterine device  Take 1 device by intrauterine route.     methocarbamol (ROBAXIN) 500 MG tablet Take 1 tablet (500 mg total) by mouth at bedtime as needed for muscle spasms. 7 tablet 0   multivitamin-iron-minerals-folic acid (CENTRUM) chewable tablet Frequency:BID   Dosage:0.0     Instructions:  Note:Dose: 1     omeprazole (PRILOSEC) 20 MG capsule TAKE 1 CAPSULE DAILY 90 capsule 3   Probiotic Product (PROBIOTIC DAILY PO) Take by mouth.     topiramate (TOPAMAX) 50 MG tablet Take 50 mg by mouth 1 day or 1 dose.     No current facility-administered medications on file prior to visit.    Past Medical History:  Diagnosis Date   Allergy    Arthritis    Asthma    GERD (gastroesophageal reflux disease)    Hypertension     Past Surgical History:  Procedure Laterality Date   gastric bipass     done 2013   MENISCUS REPAIR Bilateral    2006 and 2008   roux n y  06/26/2012   gastric by pass    Social History   Socioeconomic History   Marital status: Soil scientist    Spouse name: Not on file   Number of children: Not on file   Years of education: Not on file   Highest education level: Not on file  Occupational History   Not on file  Tobacco Use   Smoking status: Former    Packs/day: 1.50    Years: 15.00    Pack years: 22.50    Types: Cigarettes    Quit date: 02/14/2007    Years since quitting: 14.6   Smokeless tobacco: Never  Substance and Sexual Activity   Alcohol use: No   Drug use: Never   Sexual activity: Yes    Birth control/protection: I.U.D.    Comment: mirena  Other Topics Concern   Not on file  Social History Narrative   Not on file   Social Determinants of Health   Financial Resource Strain: Not on file  Food Insecurity: Not on file   Transportation Needs: Not on file  Physical Activity: Not on file  Stress: Not on file  Social Connections: Not on file    Family History  Problem Relation Age of Onset   Cancer Mother        uterine   Arthritis Mother    Hypertension Mother    Hyperlipidemia Mother    Diabetes Mother    Antithrombin III deficiency Mother    Obesity Mother    Hypertension Father    Hyperlipidemia Father    Diabetes Father  Arthritis Father    Kidney disease Father    Cancer Maternal Aunt        melanoma with mets to uterine, kidney, lung, brain   Cancer Maternal Uncle 60       stomach   Antithrombin III deficiency Maternal Uncle    Arthritis Maternal Grandmother    Cancer Maternal Grandmother        liver   Arthritis Maternal Grandfather    Stroke Maternal Grandfather    Arthritis Paternal Grandmother    Diabetes Paternal Grandmother    Arthritis Paternal Grandfather    Obesity Brother         Review of Systems  Constitutional:  Negative for fever, malaise/fatigue and weight loss.  HENT:  Negative for congestion and sore throat.   Eyes:        Negative for visual changes  Respiratory:  Negative for cough and shortness of breath.   Cardiovascular:  Negative for chest pain, palpitations and leg swelling.  Gastrointestinal:  Negative for abdominal pain, blood in stool, bowel incontinence, constipation, diarrhea and heartburn.  Genitourinary:  Negative for bladder incontinence, dysuria, frequency and urgency.  Musculoskeletal:  Positive for back pain. Negative for falls, joint pain, myalgias and neck pain.  Skin:  Negative for rash.  Neurological:  Negative for dizziness, tingling, sensory change, weakness, numbness and headaches.  Endo/Heme/Allergies:  Does not bruise/bleed easily.  Psychiatric/Behavioral:  Negative for depression, substance abuse and suicidal ideas. The patient is not nervous/anxious.    Objective:   Vitals:   09/30/21 1322  BP: 104/62  Pulse: 70  Temp:  (!) 97.2 F (36.2 C)  SpO2: 100%   Body mass index is 32.39 kg/m.   Physical Examination:  Physical Exam Constitutional:      General: She is not in acute distress. HENT:     Right Ear: Tympanic membrane, ear canal and external ear normal.     Left Ear: Tympanic membrane, ear canal and external ear normal.  Eyes:     General: No scleral icterus.    Extraocular Movements: Extraocular movements intact.     Conjunctiva/sclera: Conjunctivae normal.  Cardiovascular:     Rate and Rhythm: Normal rate and regular rhythm.     Pulses: Normal pulses.     Heart sounds: Normal heart sounds.  Pulmonary:     Effort: Pulmonary effort is normal. No respiratory distress.     Breath sounds: Normal breath sounds.  Chest:     Chest wall: No tenderness.  Abdominal:     General: Bowel sounds are normal. There is no distension.     Palpations: Abdomen is soft.  Genitourinary:    Comments: Deferred breast and pelvic exam to GYN Musculoskeletal:        General: Normal range of motion.     Cervical back: Normal, normal range of motion and neck supple.     Thoracic back: Tenderness and bony tenderness present. No swelling or spasms. Normal range of motion. No scoliosis.     Lumbar back: Normal.     Right lower leg: No edema.     Left lower leg: No edema.  Lymphadenopathy:     Cervical: No cervical adenopathy.  Skin:    General: Skin is warm and dry.     Findings: No erythema or rash.  Neurological:     Mental Status: She is alert and oriented to person, place, and time.  Psychiatric:        Mood and Affect: Mood normal.  Behavior: Behavior normal.        Thought Content: Thought content normal.   ASSESSMENT and PLAN: This visit occurred during the SARS-CoV-2 public health emergency.  Safety protocols were in place, including screening questions prior to the visit, additional usage of staff PPE, and extensive cleaning of exam room while observing appropriate contact time as indicated  for disinfecting solutions.   Akisha was seen today for annual exam.  Diagnoses and all orders for this visit:  Encounter for preventative adult health care exam with abnormal findings -     Comprehensive metabolic panel -     Lipid panel -     TSH -     CBC with Differential/Platelet  Thoracic spine pain -     Sedimentation rate -     C-reactive protein -     DG Thoracic Spine W/Swimmers -     D-Dimer, Quantitative -     HLA-B27 Antigen  Vitamin D deficiency -     Vitamin D (25 hydroxy)  Encounter for lipid screening for cardiovascular disease -     Lipid panel  Need for shingles vaccine -     Varicella-zoster vaccine IM    Sign medical release to get records from GYN. Go to lab for blood draw and x-ray (AS vs musculoskeletal strain?) Stop use of bicycle at this time. Ok to alternate between warm and cold compress as needed. Ok to use tylenol 500-659m every 8hrs as needed. Schedule nurse visit for 2nd shingrix vaccine in 210month  Problem List Items Addressed This Visit       Other   Vitamin D deficiency   Relevant Orders   Vitamin D (25 hydroxy)   Other Visit Diagnoses     Encounter for preventative adult health care exam with abnormal findings    -  Primary   Relevant Orders   Comprehensive metabolic panel   Lipid panel   TSH   CBC with Differential/Platelet   Thoracic spine pain       Relevant Orders   Sedimentation rate   C-reactive protein   DG Thoracic Spine W/Swimmers   D-Dimer, Quantitative   HLA-B27 Antigen   Encounter for lipid screening for cardiovascular disease       Relevant Orders   Lipid panel   Need for shingles vaccine       Relevant Orders   Varicella-zoster vaccine IM (Completed)       Follow up: Return in about 1 year (around 09/30/2022) for CPE (fasting).  ChWilfred LacyNP

## 2021-10-01 ENCOUNTER — Encounter: Payer: Self-pay | Admitting: Nurse Practitioner

## 2021-10-01 LAB — D-DIMER, QUANTITATIVE: D-Dimer, Quant: 0.67 mcg/mL FEU — ABNORMAL HIGH (ref <0.50)

## 2021-10-01 LAB — CBC WITH DIFFERENTIAL/PLATELET
Basophils Absolute: 0.1 10*3/uL (ref 0.0–0.1)
Basophils Relative: 0.8 % (ref 0.0–3.0)
Eosinophils Absolute: 0.1 10*3/uL (ref 0.0–0.7)
Eosinophils Relative: 0.9 % (ref 0.0–5.0)
HCT: 35.3 % — ABNORMAL LOW (ref 36.0–46.0)
Hemoglobin: 11.4 g/dL — ABNORMAL LOW (ref 12.0–15.0)
Lymphocytes Relative: 24.1 % (ref 12.0–46.0)
Lymphs Abs: 1.6 10*3/uL (ref 0.7–4.0)
MCHC: 32.2 g/dL (ref 30.0–36.0)
MCV: 85.7 fl (ref 78.0–100.0)
Monocytes Absolute: 0.6 10*3/uL (ref 0.1–1.0)
Monocytes Relative: 9.6 % (ref 3.0–12.0)
Neutro Abs: 4.3 10*3/uL (ref 1.4–7.7)
Neutrophils Relative %: 64.6 % (ref 43.0–77.0)
Platelets: 274 10*3/uL (ref 150.0–400.0)
RBC: 4.12 Mil/uL (ref 3.87–5.11)
RDW: 17.8 % — ABNORMAL HIGH (ref 11.5–15.5)
WBC: 6.6 10*3/uL (ref 4.0–10.5)

## 2021-10-01 LAB — COMPREHENSIVE METABOLIC PANEL
ALT: 9 U/L (ref 0–35)
AST: 15 U/L (ref 0–37)
Albumin: 4 g/dL (ref 3.5–5.2)
Alkaline Phosphatase: 44 U/L (ref 39–117)
BUN: 22 mg/dL (ref 6–23)
CO2: 26 mEq/L (ref 19–32)
Calcium: 8.9 mg/dL (ref 8.4–10.5)
Chloride: 106 mEq/L (ref 96–112)
Creatinine, Ser: 0.8 mg/dL (ref 0.40–1.20)
GFR: 84.81 mL/min (ref >60.00)
Glucose, Bld: 116 mg/dL — ABNORMAL HIGH (ref 70–99)
Potassium: 3.6 mEq/L (ref 3.5–5.1)
Sodium: 138 mEq/L (ref 135–145)
Total Bilirubin: 0.3 mg/dL (ref 0.2–1.2)
Total Protein: 7.1 g/dL (ref 6.0–8.3)

## 2021-10-01 LAB — LIPID PANEL
Cholesterol: 196 mg/dL (ref 0–200)
HDL: 66.2 mg/dL (ref >39.00)
LDL Cholesterol: 111 mg/dL — ABNORMAL HIGH (ref 0–99)
NonHDL: 129.48
Total CHOL/HDL Ratio: 3
Triglycerides: 94 mg/dL (ref 0.0–149.0)
VLDL: 18.8 mg/dL (ref 0.0–40.0)

## 2021-10-01 LAB — VITAMIN D 25 HYDROXY (VIT D DEFICIENCY, FRACTURES): VITD: 31.87 ng/mL (ref 30.00–100.00)

## 2021-10-01 LAB — C-REACTIVE PROTEIN: CRP: <1 mg/dL (ref 0.5–20.0)

## 2021-10-01 LAB — SEDIMENTATION RATE: Sed Rate: 27 mm/hr (ref 0–30)

## 2021-10-01 LAB — HLA-B27 ANTIGEN: HLA-B27 Antigen: NEGATIVE

## 2021-10-01 LAB — TSH: TSH: 2.03 u[IU]/mL (ref 0.35–5.50)

## 2021-10-01 NOTE — Addendum Note (Signed)
Addended by: Alysia Penna L on: 10/01/2021 10:13 AM   Modules accepted: Orders

## 2021-10-02 ENCOUNTER — Other Ambulatory Visit: Payer: 59

## 2021-10-02 ENCOUNTER — Telehealth: Payer: Self-pay | Admitting: Nurse Practitioner

## 2021-10-02 ENCOUNTER — Encounter: Payer: Self-pay | Admitting: Nurse Practitioner

## 2021-10-02 ENCOUNTER — Other Ambulatory Visit: Payer: Self-pay

## 2021-10-02 ENCOUNTER — Other Ambulatory Visit: Payer: Self-pay | Admitting: Nurse Practitioner

## 2021-10-02 ENCOUNTER — Ambulatory Visit (HOSPITAL_BASED_OUTPATIENT_CLINIC_OR_DEPARTMENT_OTHER)
Admission: RE | Admit: 2021-10-02 | Discharge: 2021-10-02 | Disposition: A | Discharge status: Home / Self Care | Payer: 59 | Source: Ambulatory Visit | Attending: Nurse Practitioner | Admitting: Nurse Practitioner

## 2021-10-02 ENCOUNTER — Encounter (HOSPITAL_BASED_OUTPATIENT_CLINIC_OR_DEPARTMENT_OTHER): Payer: Self-pay

## 2021-10-02 ENCOUNTER — Other Ambulatory Visit (HOSPITAL_BASED_OUTPATIENT_CLINIC_OR_DEPARTMENT_OTHER): Payer: Self-pay | Admitting: Nurse Practitioner

## 2021-10-02 DIAGNOSIS — R071 Chest pain on breathing: Secondary | ICD-10-CM

## 2021-10-02 DIAGNOSIS — M546 Pain in thoracic spine: Secondary | ICD-10-CM

## 2021-10-02 DIAGNOSIS — R7989 Other specified abnormal findings of blood chemistry: Secondary | ICD-10-CM

## 2021-10-02 MED ORDER — CYCLOBENZAPRINE HCL 10 MG PO TABS: 10.0000 mg | ORAL_TABLET | Freq: Every day | ORAL | 0 refills | Status: DC

## 2021-10-02 MED ORDER — IOHEXOL 350 MG/ML SOLN
80.0000 mL | Freq: Once | INTRAVENOUS | Status: AC | PRN
  Administered 2021-10-02: 80 mL via INTRAVENOUS

## 2021-10-02 NOTE — Telephone Encounter (Signed)
Pt has questions concerning a possible chest x-ray she was suppose to get yesterday here, working today and an upcoming CT for her chest. Please call pt at [phone].

## 2021-10-02 NOTE — Telephone Encounter (Signed)
Returned patients call.

## 2021-10-02 NOTE — Telephone Encounter (Signed)
Pt is wanting to schedule a CT of her chest, this can not be done until this is authorized by her insurance. She is wanting to be seen on 10/06/21. Please advise Samone at [facility] [phone].

## 2021-10-02 NOTE — Telephone Encounter (Signed)
Obtained auth #. Pt will go to [facility] today 12:30p for CT.

## 2021-10-06 ENCOUNTER — Other Ambulatory Visit: Payer: 59

## 2021-12-29 ENCOUNTER — Other Ambulatory Visit: Payer: Self-pay

## 2021-12-29 ENCOUNTER — Ambulatory Visit (INDEPENDENT_AMBULATORY_CARE_PROVIDER_SITE_OTHER): Payer: 59

## 2021-12-29 DIAGNOSIS — Z23 Encounter for immunization: Secondary | ICD-10-CM | POA: Diagnosis not present

## 2021-12-29 NOTE — Progress Notes (Signed)
Per the orders of Alysia Penna pt is here for second dose of Shingles vaccine pt received Vaccine in Left Deltoid at 09:15, given by [place] CMA/CPT. Pt tolerated vaccine well.

## 2022-02-28 ENCOUNTER — Encounter: Payer: Self-pay | Admitting: Nurse Practitioner

## 2022-03-02 ENCOUNTER — Other Ambulatory Visit: Payer: Self-pay

## 2022-03-02 DIAGNOSIS — B349 Viral infection, unspecified: Secondary | ICD-10-CM

## 2022-03-02 MED ORDER — ALBUTEROL SULFATE HFA 108 (90 BASE) MCG/ACT IN AERS: 1.0000 | INHALATION_SPRAY | Freq: Four times a day (QID) | RESPIRATORY_TRACT | 3 refills | Status: DC | PRN

## 2022-03-02 NOTE — Telephone Encounter (Signed)
Rx request for inhaler sent via [place]. ?Chart supports Rx ?Last seen 09/2021 ?Next OV 09/2022 ?

## 2022-03-25 ENCOUNTER — Other Ambulatory Visit: Payer: Self-pay | Admitting: Nurse Practitioner

## 2022-03-25 DIAGNOSIS — K219 Gastro-esophageal reflux disease without esophagitis: Secondary | ICD-10-CM

## 2022-07-16 ENCOUNTER — Ambulatory Visit: Payer: 59

## 2022-10-11 IMAGING — CT CT ANGIO CHEST
2 of 8 series · 19 of 36 positions shown · IV contrast (omnipaque)
Comparison: None.

CLINICAL DATA: Chest pain.  Positive D-dimer level.

EXAM:
CT ANGIOGRAPHY CHEST WITH CONTRAST
TECHNIQUE: Multidetector CT imaging of the chest was performed using the
standard protocol during bolus administration of intravenous
contrast. Multiplanar CT image reconstructions and MIPs were
obtained to evaluate the vascular anatomy.
CONTRAST:  80mL OMNIPAQUE IOHEXOL 350 MG/ML SOLN

[Series 6: pe thins · axial · 0.64mm/px · z∈[-274,-19]mm · 18 of 287 slices shown]
[im 16/287  lung]
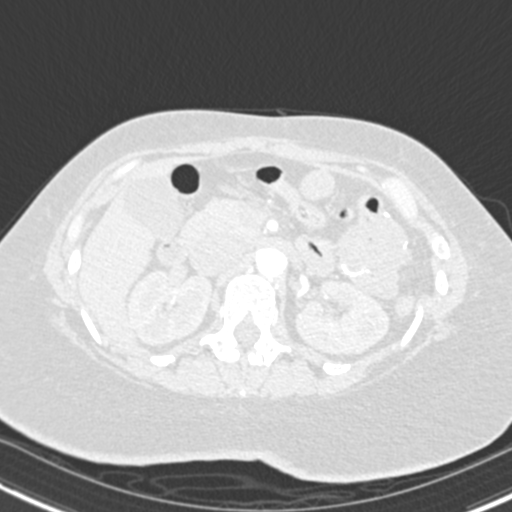
[im 31/287  mediastinal]
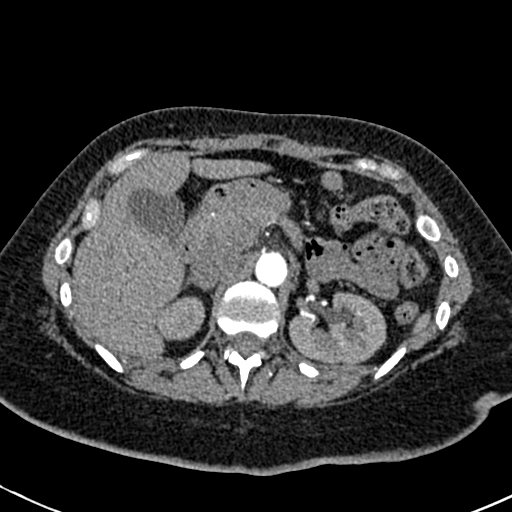
[im 46/287  lung]
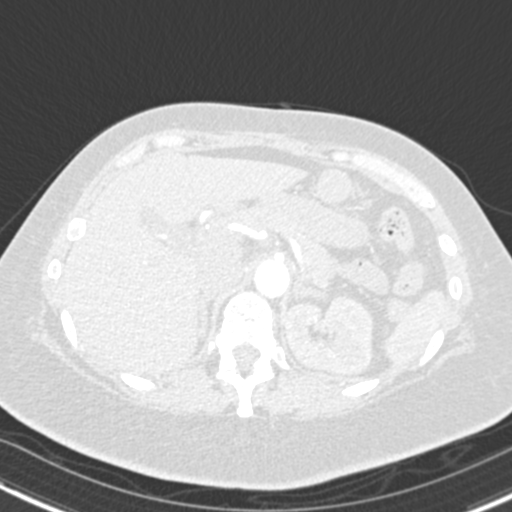
[im 61/287  mediastinal]
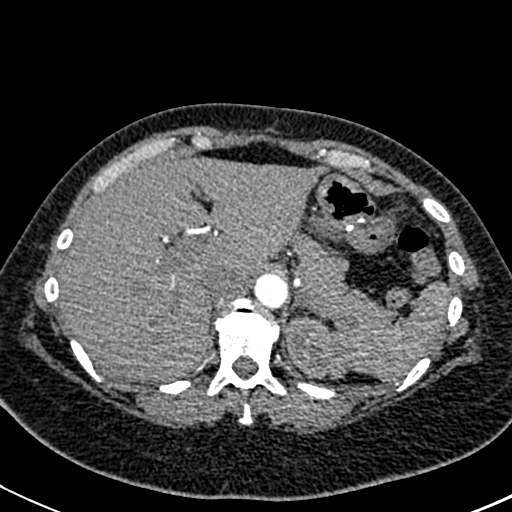
[im 76/287  lung]
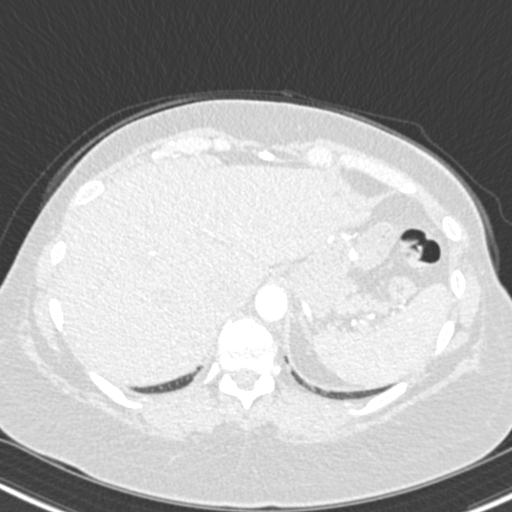
[im 91/287  mediastinal]
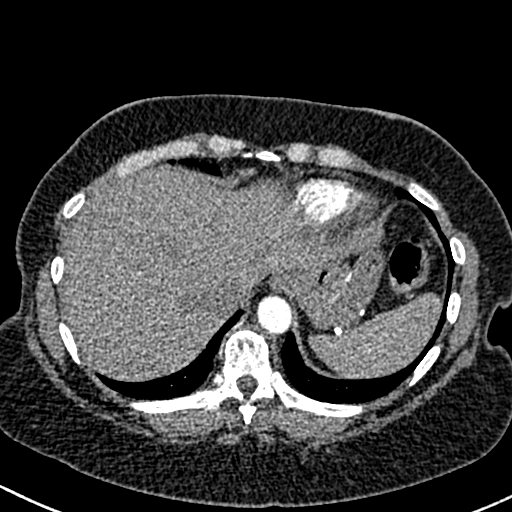
[im 106/287  lung]
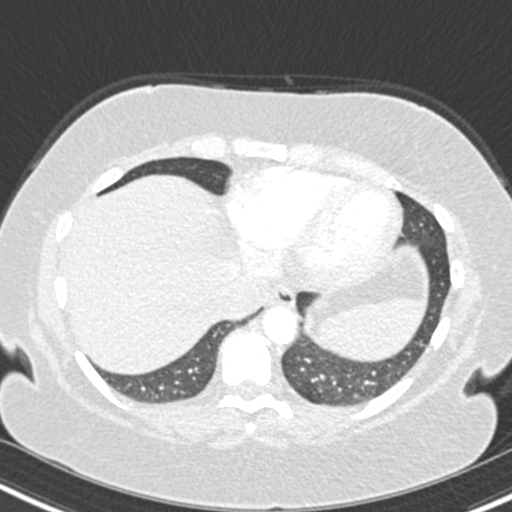
[im 121/287  mediastinal]
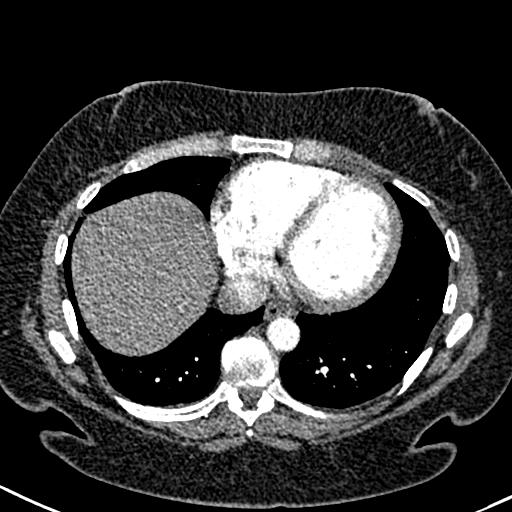
[im 136/287  lung]
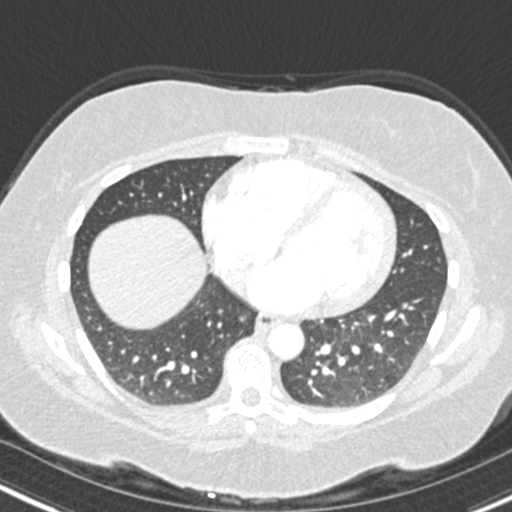
[im 151/287  mediastinal]
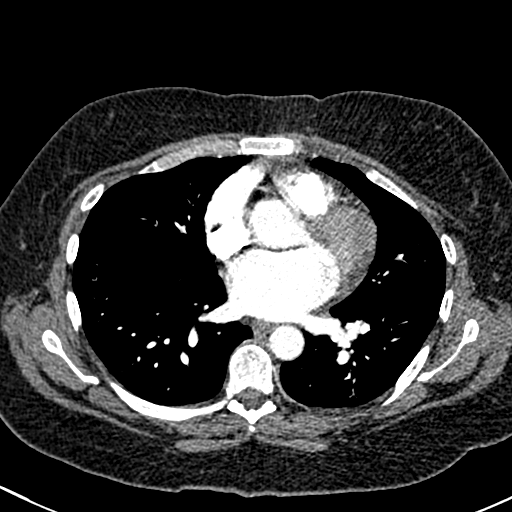
[im 166/287  lung]
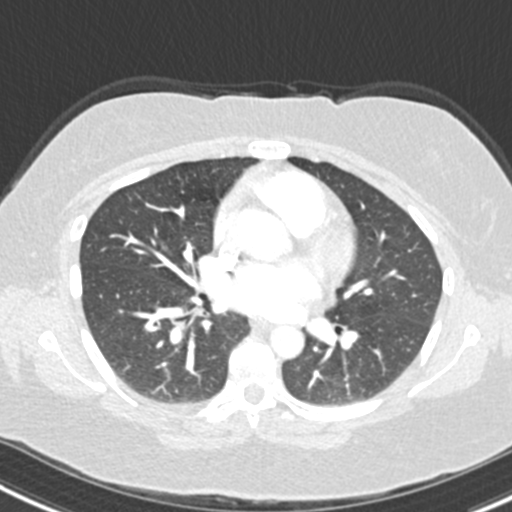
[im 181/287  mediastinal]
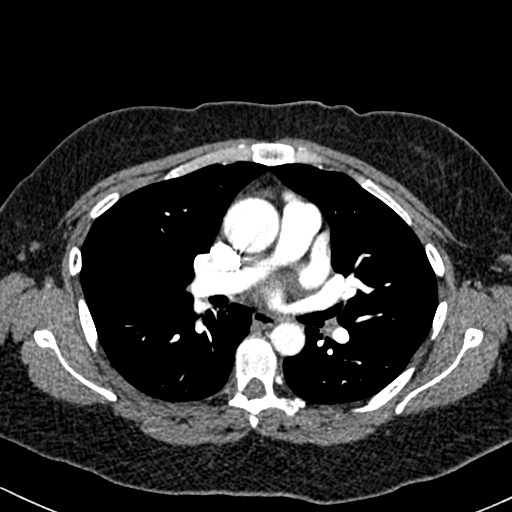
[im 196/287  lung]
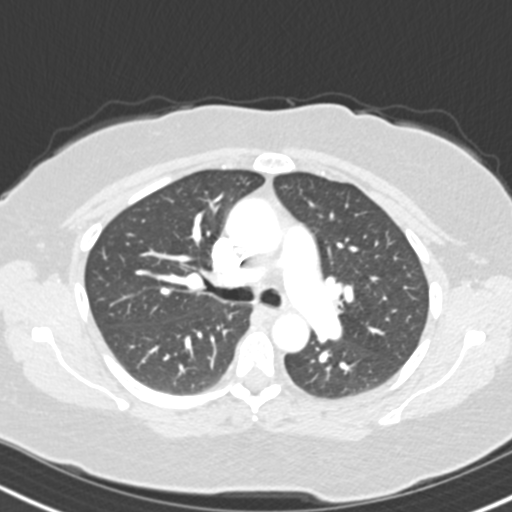
[im 211/287  mediastinal]
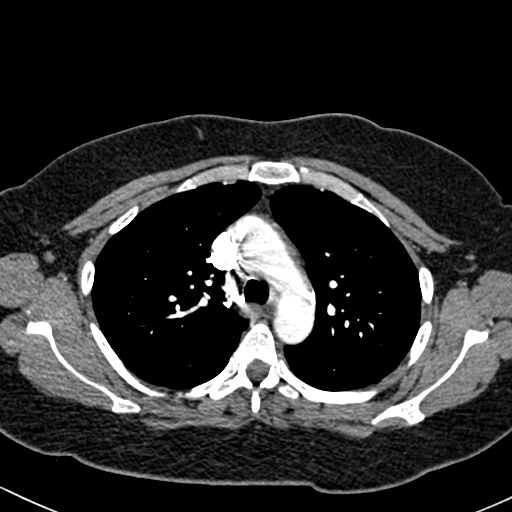
[im 226/287  lung]
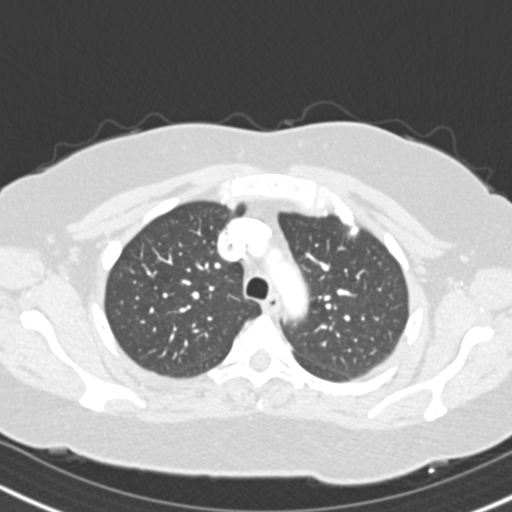
[im 241/287  mediastinal]
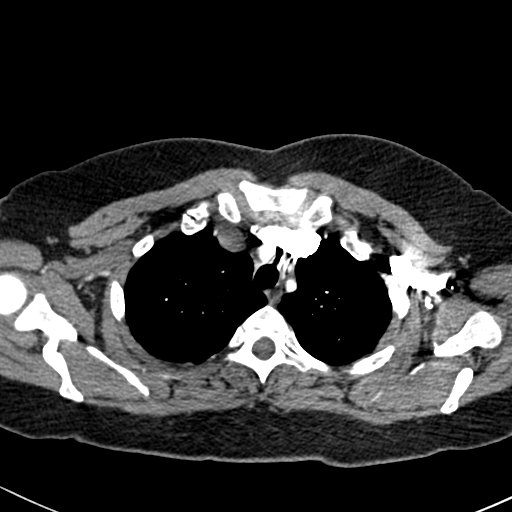
[im 256/287  lung]
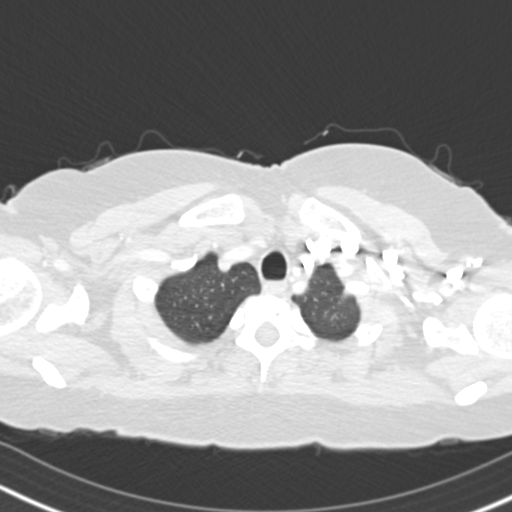
[im 271/287  mediastinal]
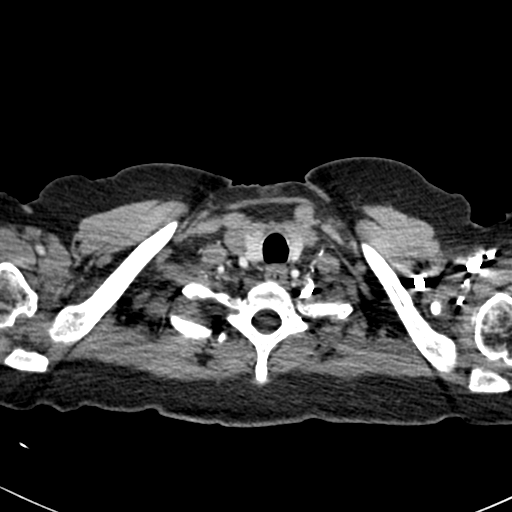

[Series 7: pe coronal mpr · coronal · 0.59mm/px · 1 of 138 slices shown]
[im 69/138  mediastinal]
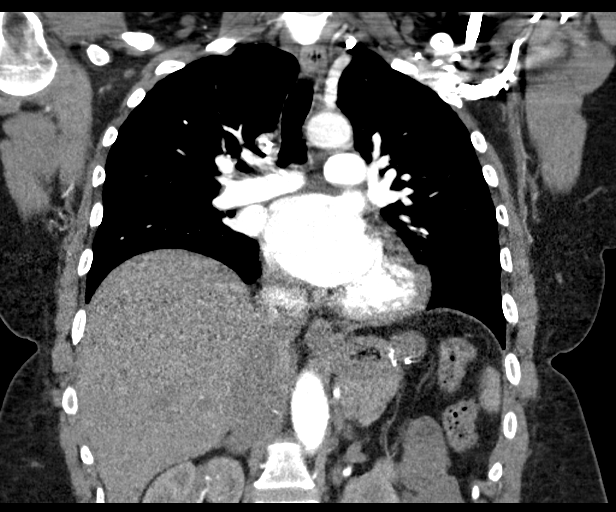

[19 of 36 positions shown; findings below may reference images not displayed]

FINDINGS: Cardiovascular: Satisfactory opacification of the pulmonary arteries
to the segmental level. No evidence of pulmonary embolism. Normal
heart size. No pericardial effusion.

Mediastinum/Nodes: No enlarged mediastinal, hilar, or axillary lymph
nodes. Thyroid gland, trachea, and esophagus demonstrate no
significant findings.

Lungs/Pleura: Lungs are clear. No pleural effusion or pneumothorax.

Upper Abdomen: No acute abnormality.

Musculoskeletal: No chest wall abnormality. No acute or significant
osseous findings.

Review of the MIP images confirms the above findings.
IMPRESSION: No definite evidence of pulmonary embolus. No definite abnormality
seen in the chest.

## 2022-10-15 ENCOUNTER — Ambulatory Visit (INDEPENDENT_AMBULATORY_CARE_PROVIDER_SITE_OTHER): Payer: Worker's Compensation

## 2022-10-15 ENCOUNTER — Ambulatory Visit (INDEPENDENT_AMBULATORY_CARE_PROVIDER_SITE_OTHER): Payer: Worker's Compensation | Admitting: Orthopedic Surgery

## 2022-10-15 ENCOUNTER — Ambulatory Visit: Payer: Self-pay

## 2022-10-15 DIAGNOSIS — M25511 Pain in right shoulder: Secondary | ICD-10-CM

## 2022-10-15 DIAGNOSIS — M542 Cervicalgia: Secondary | ICD-10-CM

## 2022-10-15 MED ORDER — METHOCARBAMOL 500 MG PO TABS: 500.0000 mg | ORAL_TABLET | Freq: Four times a day (QID) | ORAL | 0 refills | Status: DC

## 2022-10-15 MED ORDER — PREDNISONE 5 MG (21) PO TBPK: ORAL_TABLET | ORAL | 0 refills | Status: DC

## 2022-10-17 ENCOUNTER — Encounter: Payer: Self-pay | Admitting: Orthopedic Surgery

## 2022-10-17 NOTE — Progress Notes (Signed)
Office Visit Note   Patient: Elizabeth Thornton           Date of Birth: [birth year]           MRN: [id number] Visit Date: 10/15/2022 Requested by: Anne Ng, NP [address] [place],  [place] [address] PCP: Anne Ng, NP  Subjective: Chief Complaint  Patient presents with   Right Shoulder - Pain    HPI: Elizabeth Thornton is a 51 y.o. female who presents to the office reporting right shoulder pain.  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  Pain was worse the following morning.  Pain now radiates up into the neck and across her clavicular region.  The pain is fairly relentless.  It is a burning type pain.  Does not radiate down below her deltoid insertion.  Hurts her to sit in her chair to sleep.  Also she reports pain with forward flexion greater than 90 degrees.  Cannot take anti-inflammatories because of prior gastric bypass surgery.  Tylenol is not helpful.  She works at [employer] [facility].  She is still working.  She has a rheumatology consult pending for other arthritis symptoms..                ROS: All systems reviewed are negative as they relate to the chief complaint within the history of present illness.  Patient denies fevers or chills.  Assessment & Plan: Visit Diagnoses:  1. Acute pain of right shoulder   2. Neck pain     Plan: Impression is possible radiculopathy.  Shoulder exam and radiographs somewhat underwhelming today for rotator cuff pathology.  With the way this is radiating up into her head and the burning nature of the pain as well as the relatively constant nature of the pain I think this could be aggravation of the existing degenerative changes at C5-6.  Plan is Medrol Dosepak 6-day course with Robaxin.  Work note no lifting greater than 20 pounds for 2 weeks.  2-week return for clinical recheck to see how the symptoms evolve and for decision for further imaging of the neck and/or shoulder at that  time.  Follow-Up Instructions: Return in about 2 weeks (around 10/29/2022).   Orders:  Orders Placed This Encounter  Procedures   XR Cervical Spine 2 or 3 views   XR Shoulder Right   Meds ordered this encounter  Medications   predniSONE (STERAPRED UNI-PAK 21 TAB) 5 MG (21) TBPK tablet    Sig: Take dosepak as directed    Dispense:  21 tablet    Refill:  0   methocarbamol (ROBAXIN) 500 MG tablet    Sig: Take 1 tablet (500 mg total) by mouth 4 (four) times daily.    Dispense:  30 tablet    Refill:  0      Procedures: No procedures performed   Clinical Data: No additional findings.  Objective: Vital Signs: There were no vitals taken for this visit.  Physical Exam:  Constitutional: Patient appears well-developed HEENT:  Head: Normocephalic Eyes:EOM are normal Neck: Normal range of motion Cardiovascular: Normal rate Pulmonary/chest: Effort normal Neurologic: Patient is alert Skin: Skin is warm Psychiatric: Patient has normal mood and affect  Ortho Exam: Ortho exam demonstrates pain with rotation of the head to the right.  She has extension to 30 and flexion chin to chest.  5 out of 5 grip EPL FPL interosseous wrist/wrist and bicep triceps and deltoid strength.  No  coarse grinding or crepitus with internal/external rotation of the right arm at 90 degrees of abduction.  No discrete AC joint tenderness.  No paresthesias C5-T1.  Shoulder range of motion on the right passive is 60/100/170.  Negative O'Brien's testing.    Specialty Comments:  No specialty comments available.  Imaging: No results found.   PMFS History: Patient Active Problem List   Diagnosis Date Noted   Abnormal weight gain 05/28/2020   GERD (gastroesophageal reflux disease) 10/16/2019   Asthma 08/21/2019   Lipodystrophy 02/18/2017   H/O gastric bypass 02/17/2017   Excess skin of abdominal wall 02/17/2017   Muscle cramps at night 02/17/2017   History of DVT of lower extremity 08/26/2016   Primary  osteoarthritis of left knee 08/26/2016   Fibromyalgia 10/20/2012   Bilateral bunions 10/20/2012   Generalized osteoarthrosis 06/23/2012   Vitamin D deficiency 06/23/2012   Past Medical History:  Diagnosis Date   Allergy    Arthritis    Asthma    GERD (gastroesophageal reflux disease)    Hypertension     Family History  Problem Relation Age of Onset   Cancer Mother        uterine   Arthritis Mother    Hypertension Mother    Hyperlipidemia Mother    Diabetes Mother    Antithrombin III deficiency Mother    Obesity Mother    Hypertension Father    Hyperlipidemia Father    Diabetes Father    Arthritis Father    Kidney disease Father    Cancer Maternal Aunt        melanoma with mets to uterine, kidney, lung, brain   Cancer Maternal Uncle 60       stomach   Antithrombin III deficiency Maternal Uncle    Arthritis Maternal Grandmother    Cancer Maternal Grandmother        liver   Arthritis Maternal Grandfather    Stroke Maternal Grandfather    Arthritis Paternal Grandmother    Diabetes Paternal Grandmother    Arthritis Paternal Grandfather    Obesity Brother     Past Surgical History:  Procedure Laterality Date   gastric bipass     done 2013   MENISCUS REPAIR Bilateral    2006 and 2008   roux n y  06/26/2012   gastric by pass   Social History   Occupational History   Not on file  Tobacco Use   Smoking status: Former    Packs/day: 1.50    Years: 15.00    Total pack years: 22.50    Types: Cigarettes    Quit date: 02/14/2007    Years since quitting: 15.6   Smokeless tobacco: Never  Substance and Sexual Activity   Alcohol use: No   Drug use: Never   Sexual activity: Yes    Birth control/protection: I.U.D.    Comment: mirena

## 2022-10-20 ENCOUNTER — Encounter: Payer: Self-pay | Admitting: Nurse Practitioner

## 2022-11-12 ENCOUNTER — Ambulatory Visit (INDEPENDENT_AMBULATORY_CARE_PROVIDER_SITE_OTHER): Payer: Worker's Compensation | Admitting: Orthopedic Surgery

## 2022-11-12 ENCOUNTER — Encounter: Payer: Self-pay | Admitting: Orthopedic Surgery

## 2022-11-12 DIAGNOSIS — M5410 Radiculopathy, site unspecified: Secondary | ICD-10-CM | POA: Diagnosis not present

## 2022-11-12 NOTE — Progress Notes (Signed)
Office Visit Note   Patient: Elizabeth Thornton           Date of Birth: 17-Jul-1971           MRN: [id number] Visit Date: 11/12/2022 Requested by: Flossie Buffy, NP [place],  [place] [address] PCP: Flossie Buffy, NP  Subjective: Chief Complaint  Patient presents with   Right Shoulder - Follow-up   Follow-up    HPI: Elizabeth Thornton is a 52 y.o. female who presents to the office reporting continued right shoulder and arm pain.  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  Localizing pain radiating to the neck as well as down the arm.  Primarily superior and lateral pain which radiates front to back in the shoulder.  No real symptoms going below the elbow.  Tylenol does not give relief.  History of gastric bypass surgery so she cannot take anti-inflammatories.  She is working light duty.  Tried Medrol Dosepak and Robaxin which helped while she was taking it but then her symptoms have recurred.  She tried massage [id number] with minimal relief.  She also reports some numbness and tingling in her hand digits 2 3 and 4 which is new.  Reports 1 week of relatively severe pain as well as longer duration of chronic pain following the injury..                ROS: All systems reviewed are negative as they relate to the chief complaint within the history of present illness.  Patient denies fevers or chills.  Assessment & Plan: Visit Diagnoses:  1. Radicular pain     Plan: Impression is shoulder pain which could be shoulder or neck in origin.  I think if it is from her neck this is a far lateral type disc herniation around C4-5.  Could also be intrinsic shoulder pathology despite her exam which shows reasonably good strength but definitely more pain with overhead activities.  Due to failure of conservative management plan at this time is MRI cervical spine to evaluate right-sided radiculopathy and MRI of the right shoulder to evaluate right-sided rotator cuff tear  versus labral tear.  Out of work 3 weeks.  Minimum.  Follow-up after that study.  Follow-Up Instructions: No follow-ups on file.   Orders:  Orders Placed This Encounter  Procedures   MR Cervical Spine w/o contrast   MR SHOULDER RIGHT W CONTRAST   Arthrogram   No orders of the defined types were placed in this encounter.     Procedures: No procedures performed   Clinical Data: No additional findings.  Objective: Vital Signs: There were no vitals taken for this visit.  Physical Exam:  Constitutional: Patient appears well-developed HEENT:  Head: Normocephalic Eyes:EOM are normal Neck: Normal range of motion Cardiovascular: Normal rate Pulmonary/chest: Effort normal Neurologic: Patient is alert Skin: Skin is warm Psychiatric: Patient has normal mood and affect  Ortho Exam: Ortho exam demonstrates pretty reasonable cervical spine examination with flexion chin to chest extension 30 to 40 degrees rotation 60 degrees bilaterally.  5 out of 5 grip EPL FPL interosseous resection extension bicep triceps abductor strength with palpable radial pulses bilaterally.  No masses lymphadenopathy or skin changes noted in that shoulder girdle region.  Bilateral shoulder range of motion is 65/110/170.  Rotator cuff strength is good infraspinatus supraspinatus and subscap muscle testing.  Mild tenderness to the [facility] joint on the right and mildly positive O'Brien's testing on the right compared to the  left negative apprehension relocation testing and no coarse grinding or crepitus with internal or external rotation of the shoulder at 90 degrees of abduction.  Specialty Comments:  No specialty comments available.  Imaging: No results found.   PMFS History: Patient Active Problem List   Diagnosis Date Noted   Abnormal weight gain 05/28/2020   GERD (gastroesophageal reflux disease) 10/16/2019   Asthma 08/21/2019   Lipodystrophy 02/18/2017   H/O gastric bypass 02/17/2017   Excess skin of  abdominal wall 02/17/2017   Muscle cramps at night 02/17/2017   History of DVT of lower extremity 08/26/2016   Primary osteoarthritis of left knee 08/26/2016   Fibromyalgia 10/20/2012   Bilateral bunions 10/20/2012   Generalized osteoarthrosis 06/23/2012   Vitamin D deficiency 06/23/2012   Past Medical History:  Diagnosis Date   Allergy    Arthritis    Asthma    GERD (gastroesophageal reflux disease)    Hypertension     Family History  Problem Relation Age of Onset   Cancer Mother        uterine   Arthritis Mother    Hypertension Mother    Hyperlipidemia Mother    Diabetes Mother    Antithrombin III deficiency Mother    Obesity Mother    Hypertension Father    Hyperlipidemia Father    Diabetes Father    Arthritis Father    Kidney disease Father    Cancer Maternal Aunt        melanoma with mets to uterine, kidney, lung, brain   Cancer Maternal Uncle 60       stomach   Antithrombin III deficiency Maternal Uncle    Arthritis Maternal Grandmother    Cancer Maternal Grandmother        liver   Arthritis Maternal Grandfather    Stroke Maternal Grandfather    Arthritis Paternal Grandmother    Diabetes Paternal Grandmother    Arthritis Paternal Grandfather    Obesity Brother     Past Surgical History:  Procedure Laterality Date   gastric bipass     done 2013   MENISCUS REPAIR Bilateral    2006 and 2008   roux n y  06/26/2012   gastric by pass   Social History   Occupational History   Not on file  Tobacco Use   Smoking status: Former    Packs/day: 1.50    Years: 15.00    Total pack years: 22.50    Types: Cigarettes    Quit date: 02/14/2007    Years since quitting: 15.7   Smokeless tobacco: Never  Substance and Sexual Activity   Alcohol use: No   Drug use: Never   Sexual activity: Yes    Birth control/protection: I.U.D.    Comment: mirena

## 2022-11-16 ENCOUNTER — Telehealth: Payer: Self-pay | Admitting: Orthopedic Surgery

## 2022-11-16 ENCOUNTER — Encounter: Payer: Self-pay | Admitting: Orthopedic Surgery

## 2022-11-16 NOTE — Telephone Encounter (Signed)
[employer] forms received. To [employer]. 

## 2022-11-17 ENCOUNTER — Telehealth: Payer: Self-pay

## 2022-11-17 NOTE — Telephone Encounter (Signed)
Forwarding to Amy B to see if she is able to help

## 2022-11-17 NOTE — Telephone Encounter (Signed)
IC LMVM for Elizabeth Thornton [phone], patients WC Adjustor to please call me back regarding MRI order.  Also faxed referral for MRI to her at [phone]

## 2022-11-18 ENCOUNTER — Telehealth: Payer: Self-pay | Admitting: Orthopedic Surgery

## 2022-11-18 NOTE — Telephone Encounter (Signed)
See other note

## 2022-11-18 NOTE — Telephone Encounter (Signed)
Talked with patient to advise her of this. She will let us know once she has been scheduled. She will also bring disc with images to review with Dr Marlou Sa.

## 2022-11-18 NOTE — Telephone Encounter (Signed)
Received call from [facility] adjustor asking for a call back as soon as possible. The number to contact Angela Nevin is [phone]

## 2022-11-18 NOTE — Telephone Encounter (Signed)
Please notate messages below in MRI orders

## 2022-11-18 NOTE — Telephone Encounter (Signed)
IC Elizabeth Thornton back she confirmed that MRI orders received. She has sent them to be scheduled with facility that work comp chooses and stated that the facility will contact patient directly.

## 2022-11-22 ENCOUNTER — Other Ambulatory Visit: Payer: Self-pay | Admitting: Orthopedic Surgery

## 2022-11-23 MED ORDER — METHOCARBAMOL 500 MG PO TABS: 500.0000 mg | ORAL_TABLET | Freq: Four times a day (QID) | ORAL | 0 refills | Status: DC

## 2022-12-08 ENCOUNTER — Ambulatory Visit: Payer: 59 | Admitting: Orthopedic Surgery

## 2022-12-10 ENCOUNTER — Ambulatory Visit (INDEPENDENT_AMBULATORY_CARE_PROVIDER_SITE_OTHER): Payer: Worker's Compensation | Admitting: Orthopedic Surgery

## 2022-12-10 DIAGNOSIS — M542 Cervicalgia: Secondary | ICD-10-CM

## 2022-12-11 ENCOUNTER — Encounter: Payer: Self-pay | Admitting: Orthopedic Surgery

## 2022-12-11 NOTE — Progress Notes (Unsigned)
Office Visit Note   Patient: Elizabeth Thornton           Date of Birth: Mar 11, 1971           MRN: [id number] Visit Date: 12/10/2022 Requested by: Flossie Buffy, NP [place],  [place] [address] PCP: Flossie Buffy, NP  Subjective: Chief Complaint  Patient presents with   Other     Scan review    HPI: Elizabeth Thornton is a 52 y.o. female who presents to the office reporting right shoulder and neck pain.  Yesterday she states that she had "0 pain".  Her date of injury was in December.  She does not really want an injection in the neck or shoulder.  She works at the dialysis center 3 days a week.  Her imaging of the cervical spine and shoulder are reviewed.  Essentially it does not look like there is anything operative in the shoulder in terms of rotator cuff labral or AC joint pathology.  In the cervical spine she has mild degenerative changes diffusely but nothing definitively operative and she is not having much in the way of symptoms I do not think the risk of an injection is worth the benefit..                ROS: All systems reviewed are negative as they relate to the chief complaint within the history of present illness.  Patient denies fevers or chills.  Assessment & Plan: Visit Diagnoses:  1. Neck pain     Plan: Impression is improvement in clinical symptoms in the neck and shoulder.  I think she is okay to return to work 2 days a week for 2 weeks then 3 days a week for 4 months without consecutive days.  She is requesting a referral to our spine specialist for her neck.  I do not think there is anything necessarily operative in her neck but she would feel better about having the specialist examine her for that particular problem.  We will see her back as needed.  Follow-Up Instructions: No follow-ups on file.   Orders:  Orders Placed This Encounter  Procedures   Ambulatory referral to Orthopedic Surgery   No orders of the defined types were  placed in this encounter.     Procedures: No procedures performed   Clinical Data: No additional findings.  Objective: Vital Signs: There were no vitals taken for this visit.  Physical Exam:  Constitutional: Patient appears well-developed HEENT:  Head: Normocephalic Eyes:EOM are normal Neck: Normal range of motion Cardiovascular: Normal rate Pulmonary/chest: Effort normal Neurologic: Patient is alert Skin: Skin is warm Psychiatric: Patient has normal mood and affect  Ortho Exam: Ortho exam demonstrates reasonable cervical spine exam.  5 out of 5 grip EPL FPL interosseous resection extension bicep triceps and deltoid strength.  No definite paresthesias C5-T1.  Shoulder range of motion intact with no apprehension no asymmetric loss of external rotation and good rotator cuff strength and no AC joint tenderness  Specialty Comments:  No specialty comments available.  Imaging: No results found.   PMFS History: Patient Active Problem List   Diagnosis Date Noted   Abnormal weight gain 05/28/2020   GERD (gastroesophageal reflux disease) 10/16/2019   Asthma 08/21/2019   Lipodystrophy 02/18/2017   H/O gastric bypass 02/17/2017   Excess skin of abdominal wall 02/17/2017   Muscle cramps at night 02/17/2017   History of DVT of lower extremity 08/26/2016   Primary osteoarthritis of left knee  08/26/2016   Fibromyalgia 10/20/2012   Bilateral bunions 10/20/2012   Generalized osteoarthrosis 06/23/2012   Vitamin D deficiency 06/23/2012   Past Medical History:  Diagnosis Date   Allergy    Arthritis    Asthma    GERD (gastroesophageal reflux disease)    Hypertension     Family History  Problem Relation Age of Onset   Cancer Mother        uterine   Arthritis Mother    Hypertension Mother    Hyperlipidemia Mother    Diabetes Mother    Antithrombin III deficiency Mother    Obesity Mother    Hypertension Father    Hyperlipidemia Father    Diabetes Father    Arthritis  Father    Kidney disease Father    Cancer Maternal Aunt        melanoma with mets to uterine, kidney, lung, brain   Cancer Maternal Uncle 60       stomach   Antithrombin III deficiency Maternal Uncle    Arthritis Maternal Grandmother    Cancer Maternal Grandmother        liver   Arthritis Maternal Grandfather    Stroke Maternal Grandfather    Arthritis Paternal Grandmother    Diabetes Paternal Grandmother    Arthritis Paternal Grandfather    Obesity Brother     Past Surgical History:  Procedure Laterality Date   gastric bipass     done 2013   MENISCUS REPAIR Bilateral    2006 and 2008   roux n y  06/26/2012   gastric by pass   Social History   Occupational History   Not on file  Tobacco Use   Smoking status: Former    Packs/day: 1.50    Years: 15.00    Total pack years: 22.50    Types: Cigarettes    Quit date: 02/14/2007    Years since quitting: 15.8   Smokeless tobacco: Never  Substance and Sexual Activity   Alcohol use: No   Drug use: Never   Sexual activity: Yes    Birth control/protection: I.U.D.    Comment: mirena

## 2022-12-30 ENCOUNTER — Encounter: Payer: Self-pay | Admitting: Orthopedic Surgery

## 2022-12-30 ENCOUNTER — Ambulatory Visit (INDEPENDENT_AMBULATORY_CARE_PROVIDER_SITE_OTHER): Payer: 59 | Admitting: Orthopedic Surgery

## 2022-12-30 ENCOUNTER — Ambulatory Visit (INDEPENDENT_AMBULATORY_CARE_PROVIDER_SITE_OTHER): Payer: 59

## 2022-12-30 VITALS — BP 130/79 | HR 61 | Ht 62.0 in | Wt 184.0 lb

## 2022-12-30 DIAGNOSIS — M542 Cervicalgia: Secondary | ICD-10-CM

## 2022-12-30 NOTE — Progress Notes (Signed)
Orthopedic Spine Surgery Office Note  Assessment: Patient is a 52 y.o. female with neck pain and stiffness.  No radicular symptoms or symptoms of myelopathy   Plan: -Explained that initially conservative treatment is tried as a significant number of patients may experience relief with these treatment modalities. Discussed that the conservative treatments include:  -activity modification  -physical therapy  -over the counter pain medications  -medrol dosepak  -cervical steroid injections -Patient has tried PT, home exercises, Tylenol, heat/ice, CBD -Recommended she continue with the home exercises, Tylenol, heat/ice, CBD -She was concerned about injury to the spinal cord because her first husband was a quadriplegic.  I explained to her symptoms of cervical myelopathy that could cause functional loss with time.  I told her that she does not have stenosis or symptoms/signs consistent with myelopathy at this visit, but if she were to develop them she should be seen again.  In terms of spinal cord injury, I told her that risk always exists especially in setting of back trauma and there is not much she can do to prevent that -Patient should return to office on an as-needed basis   Patient expressed understanding of the plan and all questions were answered to the patient's satisfaction.   ___________________________________________________________________________   History:  Patient is a 52 y.o. female who presents today for cervical spine.  Patient comes in today with neck stiffness and pain.  Feels pain in the paraspinal muscles especially on the right side at the lower cervical spine.  That right side develops a burning sensation.  She has no pain radiating into her upper extremities.  There is no trauma or injury that brought on the pain.  She also feels that her neck is gotten stiffer and her range of motion is decreased.  She notes worsening pain at the extremes of range of  motion.   Weakness: Denies Difficulty with fine motor skills (e.g., buttoning shirts, handwriting): Has had clumsiness with her hands for years and attributes to arthritis.  No recent changes.  Is able to button shirts without difficulty Symptoms of imbalance: Denies Paresthesias and numbness: Denies Bowel or bladder incontinence: Denies Saddle anesthesia: Denies  Treatments tried: PT, home exercises, Tylenol, heat/ice, CBD -Recommended she continue with the home exercises, Tylenol, heat/ice, CBD  Review of systems: Denies fevers and chills, night sweats, unexplained weight loss, history of cancer.  Has had pain that wakes her at night  Past medical history: DVT x 2 Anxiety Migraines Fibromyalgia Osteoarthritis  Allergies: NSAIDs, codeine  Past surgical history:  Gastric bypass Panniculectomy  Social history: Denies use of nicotine product (smoking, vaping, patches, smokeless) Alcohol use: Denies Denies recreational drug use  Physical Exam:  General: no acute distress, appears stated age Neurologic: alert, answering questions appropriately, following commands Respiratory: unlabored breathing on room air, symmetric chest rise Psychiatric: appropriate affect, normal cadence to speech   MSK (spine):  -Strength exam      Left  Right Grip strength                5/5  5/5 Interosseus   5/5   5/5 Wrist extension  5/5  5/5 Wrist flexion   5/5  5/5 Elbow flexion   5/5  5/5 Deltoid    5/5  5/5  EHL    5/5  5/5 TA    5/5  5/5 GSC    5/5  5/5 Knee extension  5/5  5/5 Hip flexion   5/5  5/5  -Sensory exam  Sensation intact to light touch in L3-S1 nerve distributions of bilateral lower extremities  Sensation intact to light touch in C5-T1 nerve distributions of bilateral upper extremities  -Brachioradialis DTR: 2/4 on the left, 2/4 on the right -Biceps DTR: 2/4 on the left, 2/4 on the right -Achilles DTR: 2/4 on the left, 2/4 on the right -Patellar tendon DTR:  2/4 on the left, 2/4 on the right  -Spurling: Negative bilaterally -Hoffman sign: Negative bilaterally -Clonus: No beats bilaterally -Interosseous wasting: None seen -Grip and release test: Negative -Romberg: Negative -Gait: Normal -Imbalance with tandem gait: No  Left shoulder exam: No pain through range of motion Right shoulder exam: No pain through range of motion  Imaging: XR of the cervical spine from 10/15/2022 and 12/30/2022 was independently reviewed and interpreted, showing disc height loss at C4/5 and C5/6.  No fracture or dislocation seen.  Spondylolisthesis at C3/4 that measures 1.3 mm on flexion and reduces on extension.  MRI of the cervical spine from 12/06/2022 was independently reviewed and interpreted, showing DDD at C4/5 and C5/6. Disc bulge at C4/5. No central stenosis. No significant foraminal stenosis.   Patient name: Elizabeth Thornton Patient MRN: [id number] Date of visit: 12/30/22

## 2023-02-07 ENCOUNTER — Other Ambulatory Visit: Payer: Self-pay | Admitting: Surgical

## 2023-02-07 MED ORDER — METHOCARBAMOL 500 MG PO TABS: 500.0000 mg | ORAL_TABLET | Freq: Four times a day (QID) | ORAL | 0 refills | Status: DC

## 2023-02-07 NOTE — Telephone Encounter (Signed)
From: Lucita Ferrara To: Office of [place], [place] Sent: 02/07/2023 8:16 AM EDT Subject: Medication Renewal Request  Refills have been requested for the following medications:   methocarbamol (ROBAXIN) 500 MG tablet [Elizabeth Thornton]  Preferred pharmacy: [id number] - [place], [place] - [address] AT [address] Delivery method: [employer]

## 2023-02-24 ENCOUNTER — Encounter (INDEPENDENT_AMBULATORY_CARE_PROVIDER_SITE_OTHER): Payer: 59 | Admitting: Nurse Practitioner

## 2023-02-24 DIAGNOSIS — J0140 Acute pansinusitis, unspecified: Secondary | ICD-10-CM

## 2023-02-25 DIAGNOSIS — J0140 Acute pansinusitis, unspecified: Secondary | ICD-10-CM

## 2023-02-25 MED ORDER — AZITHROMYCIN 250 MG PO TABS: 250.0000 mg | ORAL_TABLET | Freq: Every day | ORAL | 0 refills | Status: DC

## 2023-02-25 NOTE — Telephone Encounter (Signed)
Please see the MyChart message reply(ies) for my assessment and plan.  The patient gave consent for this Medical Advice Message and is aware that it may result in a bill to their insurance company as well as the possibility that this may result in a co-payment or deductible. They are an established patient, but are not seeking medical advice exclusively about a problem treated during an in person or video visit in the last 7 days. I did not recommend an in person or video visit within 7 days of my reply.  I spent a total of 10 minutes cumulative time within 7 days through MyChart messaging Roshawn Ayala, NP  

## 2023-03-22 ENCOUNTER — Ambulatory Visit: Payer: 59 | Admitting: Nurse Practitioner

## 2023-03-22 ENCOUNTER — Ambulatory Visit (INDEPENDENT_AMBULATORY_CARE_PROVIDER_SITE_OTHER)
Admission: RE | Admit: 2023-03-22 | Discharge: 2023-03-22 | Disposition: A | Discharge status: Home / Self Care | Payer: 59 | Source: Ambulatory Visit | Attending: Nurse Practitioner | Admitting: Nurse Practitioner

## 2023-03-22 ENCOUNTER — Encounter: Payer: Self-pay | Admitting: Nurse Practitioner

## 2023-03-22 VITALS — BP 112/82 | HR 58 | Temp 98.7°F | Resp 16 | Ht 62.0 in | Wt 186.0 lb

## 2023-03-22 DIAGNOSIS — J029 Acute pharyngitis, unspecified: Secondary | ICD-10-CM

## 2023-03-22 DIAGNOSIS — R052 Subacute cough: Secondary | ICD-10-CM

## 2023-03-22 DIAGNOSIS — J0140 Acute pansinusitis, unspecified: Secondary | ICD-10-CM | POA: Diagnosis not present

## 2023-03-22 LAB — CBC WITH DIFFERENTIAL/PLATELET
Basophils Absolute: 0 10*3/uL (ref 0.0–0.1)
Basophils Relative: 0.9 % (ref 0.0–3.0)
Eosinophils Absolute: 0.1 10*3/uL (ref 0.0–0.7)
Eosinophils Relative: 1.5 % (ref 0.0–5.0)
HCT: 32.3 % — ABNORMAL LOW (ref 36.0–46.0)
Hemoglobin: 10.1 g/dL — ABNORMAL LOW (ref 12.0–15.0)
Lymphocytes Relative: 33.5 % (ref 12.0–46.0)
Lymphs Abs: 1.7 10*3/uL (ref 0.7–4.0)
MCHC: 31.3 g/dL (ref 30.0–36.0)
MCV: 80.1 fl (ref 78.0–100.0)
Monocytes Absolute: 0.4 10*3/uL (ref 0.1–1.0)
Monocytes Relative: 8.6 % (ref 3.0–12.0)
Neutro Abs: 2.7 10*3/uL (ref 1.4–7.7)
Neutrophils Relative %: 55.5 % (ref 43.0–77.0)
Platelets: 384 10*3/uL (ref 150.0–400.0)
RBC: 4.03 Mil/uL (ref 3.87–5.11)
RDW: 17.2 % — ABNORMAL HIGH (ref 11.5–15.5)
WBC: 5 10*3/uL (ref 4.0–10.5)

## 2023-03-22 MED ORDER — AZELASTINE HCL 0.1 % NA SOLN: 1.0000 | Freq: Two times a day (BID) | NASAL | 5 refills | Status: DC

## 2023-03-22 NOTE — Patient Instructions (Signed)
Go to lab Go to [address] for CXR   

## 2023-03-22 NOTE — Progress Notes (Unsigned)
Established Patient Visit  Patient: Elizabeth Thornton   DOB: 02/10/1971   52 y.o. Female  MRN: [id number] Visit Date: 03/22/2023  Subjective:    Chief Complaint  Patient presents with   Sore Throat    Right ear pain, headache, neck pain, LRQ pain and fatigue    HPI [name] presents with malaise, fatigue, cough, sinus congestion, post nasal drainage, sore throat and ear pain.Onset 2months ago. Minimal improvement with Claritin, azithromycin, oral prednisone dose pack, and sinus irrigations. No GERD, no lymphadenopathy, no night sweats, no recent travel  Reviewed medical, surgical, and social history today  Medications: Outpatient Medications Prior to Visit  Medication Sig   acetaminophen (TYLENOL) 500 MG tablet Take 1 tablet (500 mg total) by mouth every 8 (eight) hours as needed.   albuterol (VENTOLIN HFA) 108 (90 Base) MCG/ACT inhaler Inhale 1-2 puffs into the lungs every 6 (six) hours as needed for shortness of breath or wheezing.   calcium citrate-vitamin D (CITRACAL+D) 315-200 MG-UNIT per tablet Take by mouth.   Cholecalciferol 25 MCG (1000 UT) tablet Take 1,000 Units by mouth 1 day or 1 dose.   Cyanocobalamin (VITAMIN B-12 CR) 1000 MCG TBCR Take 2,500 mcg by mouth 2 (two) times daily.    FLUoxetine (PROZAC) 20 MG capsule Take 20 mg by mouth daily at 12 noon.   levonorgestrel (MIRENA, 52 MG,) 20 MCG/24HR IUD Mirena 20 mcg/24 hours (6 yrs) 52 mg intrauterine device  Take 1 device by intrauterine route.   methocarbamol (ROBAXIN) 500 MG tablet Take 1 tablet (500 mg total) by mouth 4 (four) times daily.   multivitamin-iron-minerals-folic acid (CENTRUM) chewable tablet Frequency:BID   Dosage:0.0     Instructions:  Note:Dose: 1   omeprazole (PRILOSEC) 20 MG capsule TAKE 1 CAPSULE DAILY   topiramate (TOPAMAX) 50 MG tablet Take 50 mg by mouth 1 day or 1 dose.   [DISCONTINUED] azithromycin (ZITHROMAX Z-PAK) 250 MG tablet Take 1 tablet (250 mg total) by mouth daily. Take  2tabs on first day, then 1tab once a day till complete (Patient not taking: Reported on 03/22/2023)   [DISCONTINUED] predniSONE (STERAPRED UNI-PAK 21 TAB) 5 MG (21) TBPK tablet Take dosepak as directed (Patient not taking: Reported on 03/22/2023)   [DISCONTINUED] Probiotic Product (PROBIOTIC DAILY PO) Take by mouth. (Patient not taking: Reported on 03/22/2023)   No facility-administered medications prior to visit.   Reviewed past medical and social history.   ROS per HPI above  Last CBC Lab Results  Component Value Date   WBC 5.0 03/22/2023   HGB 10.1 (L) 03/22/2023   HCT 32.3 (L) 03/22/2023   MCV 80.1 03/22/2023   MCH 28.2 08/03/2016   RDW 17.2 (H) 03/22/2023   PLT 384.0 03/22/2023      Objective:  BP 112/82 (BP Location: Left Arm, Patient Position: Sitting, Cuff Size: Large)   Pulse (!) 58   Temp 98.7 F (37.1 C) (Oral)   Resp 16   Ht 5\' 2"  (1.575 m)   Wt 186 lb (84.4 kg)   SpO2 99%   BMI 34.02 kg/m      Physical Exam Vitals and nursing note reviewed.  HENT:     Right Ear: Ear canal normal. No drainage, swelling or tenderness. A middle ear effusion is present. Tympanic membrane is not erythematous.     Left Ear: Ear canal normal. No drainage, swelling or tenderness. A middle ear effusion is present. Tympanic membrane  is not erythematous.     Nose: Congestion and rhinorrhea present.     Mouth/Throat:     Mouth: No oral lesions.     Pharynx: Posterior oropharyngeal erythema present. No pharyngeal swelling or oropharyngeal exudate.     Tonsils: No tonsillar exudate or tonsillar abscesses.  Eyes:     Conjunctiva/sclera: Conjunctivae normal.     Pupils: Pupils are equal, round, and reactive to light.  Cardiovascular:     Rate and Rhythm: Normal rate and regular rhythm.     Pulses: Normal pulses.     Heart sounds: Normal heart sounds.  Pulmonary:     Effort: Pulmonary effort is normal.     Breath sounds: Normal breath sounds.  Abdominal:     General: Bowel sounds are  normal.     Palpations: Abdomen is soft.     Tenderness: There is no abdominal tenderness.  Musculoskeletal:     Cervical back: Normal range of motion and neck supple.  Lymphadenopathy:     Cervical: No cervical adenopathy.  Skin:    General: Skin is warm and dry.  Neurological:     Mental Status: She is alert and oriented to person, place, and time.     No results found for any visits on 03/22/23.    Assessment & Plan:    Problem List Items Addressed This Visit   None Visit Diagnoses     Sore throat    -  Primary   Relevant Medications   azelastine (ASTELIN) 0.1 % nasal spray   Other Relevant Orders   POCT rapid strep A   POCT Mono (Epstein Barr Virus)   Subacute cough       Relevant Orders   CBC with Differential/Platelet   DG Chest 2 View   Subacute pansinusitis       Relevant Medications   azelastine (ASTELIN) 0.1 % nasal spray     Advised to switch claritin to zyrtec and start azelastine nasal spray. Start ICS if normal CXR.  Return if symptoms worsen or fail to improve.     Alysia Penna, NP

## 2023-03-23 ENCOUNTER — Encounter: Payer: Self-pay | Admitting: Nurse Practitioner

## 2023-03-23 LAB — POCT RAPID STREP A (OFFICE): Rapid Strep A Screen: NEGATIVE

## 2023-03-23 LAB — POCT MONO (EPSTEIN BARR VIRUS): Mono, POC: NEGATIVE

## 2023-03-30 ENCOUNTER — Encounter: Payer: 59 | Admitting: Nurse Practitioner

## 2023-04-21 ENCOUNTER — Other Ambulatory Visit: Payer: Self-pay | Admitting: Obstetrics and Gynecology

## 2023-04-21 DIAGNOSIS — N63 Unspecified lump in unspecified breast: Secondary | ICD-10-CM

## 2023-06-01 ENCOUNTER — Encounter (INDEPENDENT_AMBULATORY_CARE_PROVIDER_SITE_OTHER): Payer: Self-pay

## 2023-06-02 ENCOUNTER — Other Ambulatory Visit: Payer: 59

## 2023-06-03 ENCOUNTER — Other Ambulatory Visit: Payer: Self-pay | Admitting: Oncology

## 2023-06-03 DIAGNOSIS — Z006 Encounter for examination for normal comparison and control in clinical research program: Secondary | ICD-10-CM

## 2023-06-21 ENCOUNTER — Other Ambulatory Visit: Payer: Self-pay | Admitting: Nurse Practitioner

## 2023-06-21 ENCOUNTER — Ambulatory Visit (INDEPENDENT_AMBULATORY_CARE_PROVIDER_SITE_OTHER): Payer: 59 | Admitting: Nurse Practitioner

## 2023-06-21 ENCOUNTER — Encounter: Payer: Self-pay | Admitting: Nurse Practitioner

## 2023-06-21 VITALS — BP 110/80 | HR 57 | Temp 98.2°F | Resp 16 | Ht 62.0 in | Wt 192.0 lb

## 2023-06-21 DIAGNOSIS — E559 Vitamin D deficiency, unspecified: Secondary | ICD-10-CM

## 2023-06-21 DIAGNOSIS — F5081 Binge eating disorder: Secondary | ICD-10-CM

## 2023-06-21 DIAGNOSIS — Z136 Encounter for screening for cardiovascular disorders: Secondary | ICD-10-CM

## 2023-06-21 DIAGNOSIS — Z9884 Bariatric surgery status: Secondary | ICD-10-CM

## 2023-06-21 DIAGNOSIS — Z0001 Encounter for general adult medical examination with abnormal findings: Secondary | ICD-10-CM

## 2023-06-21 DIAGNOSIS — Z13220 Encounter for screening for lipoid disorders: Secondary | ICD-10-CM

## 2023-06-21 DIAGNOSIS — Z1211 Encounter for screening for malignant neoplasm of colon: Secondary | ICD-10-CM | POA: Diagnosis not present

## 2023-06-21 DIAGNOSIS — F50819 Binge eating disorder, unspecified: Secondary | ICD-10-CM | POA: Insufficient documentation

## 2023-06-21 DIAGNOSIS — D538 Other specified nutritional anemias: Secondary | ICD-10-CM | POA: Diagnosis not present

## 2023-06-21 DIAGNOSIS — M5030 Other cervical disc degeneration, unspecified cervical region: Secondary | ICD-10-CM | POA: Insufficient documentation

## 2023-06-21 DIAGNOSIS — Z0000 Encounter for general adult medical examination without abnormal findings: Secondary | ICD-10-CM | POA: Diagnosis not present

## 2023-06-21 MED ORDER — BUPROPION HCL ER (SR) 100 MG PO TB12: ORAL_TABLET | ORAL | 5 refills | Status: DC

## 2023-06-21 NOTE — Progress Notes (Signed)
Complete physical exam  Patient: Elizabeth Thornton   DOB: 05-31-1971   52 y.o. Female  MRN: [id number] Visit Date: 06/21/2023  Subjective:    Chief Complaint  Patient presents with   Annual Exam    Not Fasting    Elizabeth Thornton is a 52 y.o. female who presents today for a complete physical exam. She reports consuming a low fat diet.  Cycling daily  She generally feels well. She reports sleeping well. She does have additional problems to discuss today.  Vision:Yes Dental:No STD Screen:No  BP Readings from Last 3 Encounters:  06/21/23 110/80  03/22/23 112/82  12/30/22 130/79   Wt Readings from Last 3 Encounters:  06/21/23 192 lb (87.1 kg)  03/22/23 186 lb (84.4 kg)  12/30/22 184 lb (83.5 kg)   Opted to get routine colonoscopy in place of cologuard.  Most recent fall risk assessment:    06/21/2023    2:02 PM  Fall Risk   Falls in the past year? 0  Number falls in past yr: 0  Injury with Fall? 0  Risk for fall due to : No Fall Risks  Follow up Falls evaluation completed   Depression screen:Yes - No Depression  Most recent depression screenings:    06/21/2023    2:03 PM 03/22/2023   11:09 AM  PHQ 2/9 Scores  PHQ - 2 Score 0 0  PHQ- 9 Score 3    HPI  Binge eating disorder Continues to struggle with emotional/stress eating despite gastric bypass. Hx of Anxiety and Depressive disorder-current use of prozac. Denies need for psychology referral. She has maintain daily exercise: low impact due to advanced DDD and DJD She has maintained a low carb/low fat diet with the guidance of nutritionist. Topamax and Wellbutrin previous prescribed by [facility]ariat[facility] clinic provider. She opted to discontinue medications 9months ago. She wants to resume wellbutrin.  Wellbutrin sent  H/O gastric bypass  Iron and B12 deficient due to previous gastric surgery and discontinuation of supplements. Iron/ferritin 81/4.3, B12 331 Current use of bariatric multivitamins and B12  injection per bariatric clinic Repeat CBC, B12, iron, and vit D  Past Medical History:  Diagnosis Date   Allergy    Arthritis    Asthma    GERD (gastroesophageal reflux disease)    Hypertension    Past Surgical History:  Procedure Laterality Date   gastric bipass     done 2013   MENISCUS REPAIR Bilateral    2006 and 2008   roux n y  06/26/2012   gastric by pass   Social History   Socioeconomic History   Marital status: Media planner    Spouse name: Not on file   Number of children: Not on file   Years of education: Not on file   Highest education level: Not on file  Occupational History   Not on file  Tobacco Use   Smoking status: Former    Current packs/day: 0.00    Average packs/day: 1.5 packs/day for 15.0 years (22.5 ttl pk-yrs)    Types: Cigarettes    Start date: 02/14/1992    Quit date: 02/14/2007    Years since quitting: 16.3   Smokeless tobacco: Never  Substance and Sexual Activity   Alcohol use: No   Drug use: Never   Sexual activity: Yes    Birth control/protection: I.U.D.    Comment: mirena  Other Topics Concern   Not on file  Social History Narrative   Not on file   Social  Determinants of Health   Financial Resource Strain: Not on file  Food Insecurity: Not on file  Transportation Needs: Not on file  Physical Activity: Not on file  Stress: Not on file  Social Connections: Unknown (11/22/2022)   Received from [facility], [facility]   Social Network    Social Network: Not on file  Intimate Partner Violence: Unknown (11/22/2022)   Received from [facility], [facility]   HITS    Physically Hurt: Not on file    Insult or Talk Down To: Not on file    Threaten Physical Harm: Not on file    Scream or Curse: Not on file   Family Status  Relation Name Status   Mother Elizabeth Thornton Deceased       ovarian   Father Elizabeth Thornton Alive   Brother  Alive   Mat Aunt Elizabeth Thornton Deceased       breast   Mat Uncle  Deceased       lung    MGM Elizabeth Thornton Deceased   MGF Elizabeth Thornton Deceased   PGM Elizabeth Thornton Deceased   PGF  Deceased   Brother Elizabeth Thornton (Not Specified)  No partnership data on file   Family History  Problem Relation Age of Onset   Cancer Mother 58       uterine   Arthritis Mother    Hypertension Mother    Hyperlipidemia Mother    Diabetes Mother    Antithrombin III deficiency Mother    Obesity Mother    Hypertension Father    Hyperlipidemia Father    Diabetes Father    Arthritis Father    Kidney disease Father    Cancer Maternal Aunt 43       melanoma with mets to uterine, kidney, lung, brain   Cancer Maternal Uncle 60       stomach   Antithrombin III deficiency Maternal Uncle    Arthritis Maternal Grandmother    Cancer Maternal Grandmother 5       liver   Arthritis Maternal Grandfather    Stroke Maternal Grandfather    Arthritis Paternal Grandmother    Diabetes Paternal Grandmother    Arthritis Paternal Grandfather    Obesity Brother    Allergies  Allergen Reactions   Codeine    Egg-Derived Products     Other reaction(s): SWELLING Other reaction(s): SWELLING   Kiwi Extract    Nsaids     Had gastric bypass--cant take it.    Other     No blind NG tube and no Sugar--doesn't react well   Tolmetin     Had gastric bypass--cant take it.     Patient Care Team: Elizabeth Thornton, Elizabeth Gains, NP as PCP - General (Internal Medicine)   Medications: Outpatient Medications Prior to Visit  Medication Sig   acetaminophen (TYLENOL) 500 MG tablet Take 1 tablet (500 mg total) by mouth every 8 (eight) hours as needed.   albuterol (VENTOLIN HFA) 108 (90 Base) MCG/ACT inhaler Inhale 1-2 puffs into the lungs every 6 (six) hours as needed for shortness of breath or wheezing.   Alpha-Lipoic Acid 600 MG CAPS Take 1 capsule by mouth 2 (two) times daily.   azelastine (ASTELIN) 0.1 % nasal spray Place 1 spray into both nostrils 2 (two) times daily. Use in each nostril as directed   calcium citrate-vitamin D  (CITRACAL+D) 315-200 MG-UNIT per tablet Take by mouth.   Cholecalciferol 25 MCG (1000 UT) tablet Take 1,000 Units by mouth 1 day or  1 dose.   Cyanocobalamin (VITAMIN B-12 CR) 1000 MCG TBCR Take 2,500 mcg by mouth 2 (two) times daily.    FLUoxetine (PROZAC) 20 MG capsule Take 20 mg by mouth daily at 12 noon.   levonorgestrel (MIRENA, 52 MG,) 20 MCG/24HR IUD Mirena 20 mcg/24 hours (6 yrs) 52 mg intrauterine device  Take 1 device by intrauterine route.   methocarbamol (ROBAXIN) 500 MG tablet Take 1 tablet (500 mg total) by mouth 4 (four) times daily.   multivitamin-iron-minerals-folic acid (CENTRUM) chewable tablet Frequency:BID   Dosage:0.0     Instructions:  Note:Dose: 1   omeprazole (PRILOSEC) 20 MG capsule TAKE 1 CAPSULE DAILY   topiramate (TOPAMAX) 50 MG tablet Take 50 mg by mouth 1 day or 1 dose.   No facility-administered medications prior to visit.    Review of Systems  Constitutional:  Negative for activity change, appetite change and unexpected weight change.  Respiratory: Negative.    Cardiovascular: Negative.   Gastrointestinal: Negative.   Endocrine: Negative for cold intolerance and heat intolerance.  Genitourinary: Negative.   Musculoskeletal: Negative.   Skin: Negative.   Neurological: Negative.   Hematological: Negative.   Psychiatric/Behavioral:  Negative for behavioral problems, decreased concentration, dysphoric mood, hallucinations, self-injury, sleep disturbance and suicidal ideas. The patient is not nervous/anxious.         Objective:  BP 110/80 (BP Location: Left Arm, Patient Position: Sitting, Cuff Size: Large)   Pulse (!) 57   Temp 98.2 F (36.8 C) (Temporal)   Resp 16   Ht 5\' 2"  (1.575 m)   Wt 192 lb (87.1 kg)   SpO2 100%   BMI 35.12 kg/m     Physical Exam Vitals and nursing note reviewed.  Constitutional:      General: She is not in acute distress. HENT:     Right Ear: Tympanic membrane, ear canal and external ear normal.     Left Ear:  Tympanic membrane, ear canal and external ear normal.     Nose: Nose normal.  Eyes:     Extraocular Movements: Extraocular movements intact.     Conjunctiva/sclera: Conjunctivae normal.     Pupils: Pupils are equal, round, and reactive to light.  Neck:     Thyroid: No thyroid mass, thyromegaly or thyroid tenderness.  Cardiovascular:     Rate and Rhythm: Normal rate and regular rhythm.     Pulses: Normal pulses.     Heart sounds: Normal heart sounds.  Pulmonary:     Effort: Pulmonary effort is normal.     Breath sounds: Normal breath sounds.  Abdominal:     General: Bowel sounds are normal.     Palpations: Abdomen is soft.  Musculoskeletal:        General: Normal range of motion.     Cervical back: Normal range of motion and neck supple.     Right lower leg: No edema.     Left lower leg: No edema.  Lymphadenopathy:     Cervical: No cervical adenopathy.  Skin:    General: Skin is warm and dry.  Neurological:     Mental Status: She is alert and oriented to person, place, and time.     Cranial Nerves: No cranial nerve deficit.  Psychiatric:        Mood and Affect: Mood normal.        Behavior: Behavior normal.        Thought Content: Thought content normal.      No results found for any visits  on 06/21/23.    Assessment & Plan:    Routine Health Maintenance and Physical Exam  Immunization History  Administered Date(s) Administered   Influenza Inj Mdck Quad Pf 09/05/2019   Influenza, Quadrivalent, Recombinant, Inj, Pf 08/08/2017, 08/15/2018   Influenza,trivalent, recombinat, inj, PF 08/26/2016   Influenza-Unspecified 08/26/2016, 08/08/2017, 08/15/2018   Pneumococcal Conjugate-13 10/07/2016   Pneumococcal-Unspecified 10/07/2016   Tdap 08/26/2016   Zoster Recombinant(Shingrix) 09/30/2021, 12/29/2021   Health Maintenance  Topic Date Due   Colonoscopy  Never done   MAMMOGRAM  01/07/2023   INFLUENZA VACCINE  06/02/2023   Hepatitis C Screening  06/20/2024 (Originally  [birth year]/1987)   HIV Screening  06/20/2024 (Originally 06/26/1984)   PAP SMEAR-Modifier  01/07/2024   DTaP/Tdap/Td (2 - Td or Tdap) 08/26/2026   Zoster Vaccines- Shingrix  Completed   HPV VACCINES  Aged Out   COVID-19 Vaccine  Discontinued   Fecal DNA (Cologuard)  Discontinued   Discussed health benefits of physical activity, and encouraged her to engage in regular exercise appropriate for her age and condition.  Problem List Items Addressed This Visit     Binge eating disorder    Continues to struggle with emotional/stress eating despite gastric bypass. Hx of Anxiety and Depressive disorder-current use of prozac. Denies need for psychology referral. She has maintain daily exercise: low impact due to advanced DDD and DJD She has maintained a low carb/low fat diet with the guidance of nutritionist. Topamax and Wellbutrin previous prescribed by [facility] Bariatric clinic provider. She opted to discontinue medications 9months ago. She wants to resume wellbutrin.  Wellbutrin sent      Relevant Medications   buPROPion ER (WELLBUTRIN SR) 100 MG 12 hr tablet   H/O gastric bypass     Iron and B12 deficient due to previous gastric surgery and discontinuation of supplements. Iron/ferritin 81/4.3, B12 331 Current use of bariatric multivitamins and B12 injection per bariatric clinic Repeat CBC, B12, iron, and vit D      Vitamin D deficiency   Relevant Orders   VITAMIN D 25 Hydroxy (Vit-D Deficiency, Fractures)   Other Visit Diagnoses     Encounter for preventative adult health care exam with abnormal findings    -  Primary   Relevant Orders   Lipid panel   Colon cancer screening       Relevant Orders   Ambulatory referral to Gastroenterology   Other specified nutritional anemias       Relevant Orders   Iron, TIBC and Ferritin Panel   B12   CBC with Differential/Platelet   Encounter for lipid screening for cardiovascular disease       Relevant Orders   Lipid panel      Return in about  1 year (around 06/20/2024) for CPE (fasting).     Alysia Penna, NP

## 2023-06-21 NOTE — Patient Instructions (Addendum)
Schedule fasting lab appt. Need to be fasting 8hrs prior to blood draw. Ok to drink water. Continue Heart healthy diet and daily exercise. Maintain current medications. Schedule appointment with GI: [phone]  Preventive Care 31-54 Years Old, Female Preventive care refers to lifestyle choices and visits with your health care provider that can promote health and wellness. Preventive care visits are also called wellness exams. What can I expect for my preventive care visit? Counseling Your health care provider may ask you questions about your: Medical history, including: Past medical problems. Family medical history. Pregnancy history. Current health, including: Menstrual cycle. Method of birth control. Emotional well-being. Home life and relationship well-being. Sexual activity and sexual health. Lifestyle, including: Alcohol, nicotine or tobacco, and drug use. Access to firearms. Diet, exercise, and sleep habits. Work and work Astronomer. Sunscreen use. Safety issues such as seatbelt and bike helmet use. Physical exam Your health care provider will check your: Height and weight. These may be used to calculate your BMI (body mass index). BMI is a measurement that tells if you are at a healthy weight. Waist circumference. This measures the distance around your waistline. This measurement also tells if you are at a healthy weight and may help predict your risk of certain diseases, such as type 2 diabetes and high blood pressure. Heart rate and blood pressure. Body temperature. Skin for abnormal spots. What immunizations do I need?  Vaccines are usually given at various ages, according to a schedule. Your health care provider will recommend vaccines for you based on your age, medical history, and lifestyle or other factors, such as travel or where you work. What tests do I need? Screening Your health care provider may recommend screening tests for certain conditions. This may  include: Lipid and cholesterol levels. Diabetes screening. This is done by checking your blood sugar (glucose) after you have not eaten for a while (fasting). Pelvic exam and Pap test. Hepatitis B test. Hepatitis C test. HIV (human immunodeficiency virus) test. STI (sexually transmitted infection) testing, if you are at risk. Lung cancer screening. Colorectal cancer screening. Mammogram. Talk with your health care provider about when you should start having regular mammograms. This may depend on whether you have a family history of breast cancer. BRCA-related cancer screening. This may be done if you have a family history of breast, ovarian, tubal, or peritoneal cancers. Bone density scan. This is done to screen for osteoporosis. Talk with your health care provider about your test results, treatment options, and if necessary, the need for more tests. Follow these instructions at home: Eating and drinking  Eat a diet that includes fresh fruits and vegetables, whole grains, lean protein, and low-fat dairy products. Take vitamin and mineral supplements as recommended by your health care provider. Do not drink alcohol if: Your health care provider tells you not to drink. You are pregnant, may be pregnant, or are planning to become pregnant. If you drink alcohol: Limit how much you have to 0-1 drink a day. Know how much alcohol is in your drink. In the [place] one drink equals one 12 oz bottle of beer (355 mL), one 5 oz glass of wine (148 mL), or one 1 oz glass of hard liquor (44 mL). Lifestyle Brush your teeth every morning and night with fluoride toothpaste. Floss one time each day. Exercise for at least 30 minutes 5 or more days each week. Do not use any products that contain nicotine or tobacco. These products include cigarettes, chewing tobacco, and vaping  devices, such as e-cigarettes. If you need help quitting, ask your health care provider. Do not use drugs. If you are sexually  active, practice safe sex. Use a condom or other form of protection to prevent STIs. If you do not wish to become pregnant, use a form of birth control. If you plan to become pregnant, see your health care provider for a prepregnancy visit. Take aspirin only as told by your health care provider. Make sure that you understand how much to take and what form to take. Work with your health care provider to find out whether it is safe and beneficial for you to take aspirin daily. Find healthy ways to manage stress, such as: Meditation, yoga, or listening to music. Journaling. Talking to a trusted person. Spending time with friends and family. Minimize exposure to UV radiation to reduce your risk of skin cancer. Safety Always wear your seat belt while driving or riding in a vehicle. Do not drive: If you have been drinking alcohol. Do not ride with someone who has been drinking. When you are tired or distracted. While texting. If you have been using any mind-altering substances or drugs. Wear a helmet and other protective equipment during sports activities. If you have firearms in your house, make sure you follow all gun safety procedures. Seek help if you have been physically or sexually abused. What's next? Visit your health care provider once a year for an annual wellness visit. Ask your health care provider how often you should have your eyes and teeth checked. Stay up to date on all vaccines. This information is not intended to replace advice given to you by your health care provider. Make sure you discuss any questions you have with your health care provider. Document Revised: 04/15/2021 Document Reviewed: 04/15/2021 Elsevier Patient Education  2024 [employer].

## 2023-06-21 NOTE — Assessment & Plan Note (Addendum)
Continues to struggle with emotional/stress eating despite gastric bypass. Hx of Anxiety and Depressive disorder-current use of prozac. Denies need for psychology referral. She has maintain daily exercise: low impact due to advanced DDD and DJD She has maintained a low carb/low fat diet with the guidance of nutritionist. Topamax and Wellbutrin previous prescribed by [facility] Bariatric clinic provider. She opted to discontinue medications 9months ago. She wants to resume wellbutrin. Reviewed records from Bariatric clinic (OV notes and lab results)  Wellbutrin sent

## 2023-06-21 NOTE — Assessment & Plan Note (Addendum)
Iron and B12 deficient due to previous gastric surgery and discontinuation of supplements. Iron/ferritin 81/4.3, B12 331 Current use of bariatric multivitamins and B12 injection per bariatric clinic Repeat CBC, B12, iron, and vit D

## 2023-06-24 ENCOUNTER — Encounter: Payer: 59 | Admitting: Nurse Practitioner

## 2023-06-28 ENCOUNTER — Other Ambulatory Visit: Payer: Self-pay | Admitting: Medical Genetics

## 2023-06-28 ENCOUNTER — Other Ambulatory Visit (INDEPENDENT_AMBULATORY_CARE_PROVIDER_SITE_OTHER): Payer: 59

## 2023-06-28 DIAGNOSIS — Z13220 Encounter for screening for lipoid disorders: Secondary | ICD-10-CM | POA: Diagnosis not present

## 2023-06-28 DIAGNOSIS — Z006 Encounter for examination for normal comparison and control in clinical research program: Secondary | ICD-10-CM

## 2023-06-28 DIAGNOSIS — E559 Vitamin D deficiency, unspecified: Secondary | ICD-10-CM

## 2023-06-28 DIAGNOSIS — Z0001 Encounter for general adult medical examination with abnormal findings: Secondary | ICD-10-CM

## 2023-06-28 DIAGNOSIS — D538 Other specified nutritional anemias: Secondary | ICD-10-CM | POA: Diagnosis not present

## 2023-06-28 DIAGNOSIS — Z136 Encounter for screening for cardiovascular disorders: Secondary | ICD-10-CM

## 2023-06-28 DIAGNOSIS — D508 Other iron deficiency anemias: Secondary | ICD-10-CM

## 2023-06-28 LAB — LIPID PANEL
Cholesterol: 176 mg/dL (ref 0–200)
HDL: 52 mg/dL (ref >39.00)
LDL Cholesterol: 101 mg/dL — ABNORMAL HIGH (ref 0–99)
NonHDL: 123.83
Total CHOL/HDL Ratio: 3
Triglycerides: 113 mg/dL (ref 0.0–149.0)
VLDL: 22.6 mg/dL (ref 0.0–40.0)

## 2023-06-28 LAB — CBC WITH DIFFERENTIAL/PLATELET
Basophils Absolute: 0 10*3/uL (ref 0.0–0.1)
Basophils Relative: 0.5 % (ref 0.0–3.0)
Eosinophils Absolute: 0.2 10*3/uL (ref 0.0–0.7)
Eosinophils Relative: 4.8 % (ref 0.0–5.0)
HCT: 30.8 % — ABNORMAL LOW (ref 36.0–46.0)
Hemoglobin: 9.6 g/dL — ABNORMAL LOW (ref 12.0–15.0)
Lymphocytes Relative: 36.9 % (ref 12.0–46.0)
Lymphs Abs: 1.6 10*3/uL (ref 0.7–4.0)
MCHC: 31.1 g/dL (ref 30.0–36.0)
MCV: 79.6 fl (ref 78.0–100.0)
Monocytes Absolute: 0.4 10*3/uL (ref 0.1–1.0)
Monocytes Relative: 9.4 % (ref 3.0–12.0)
Neutro Abs: 2 10*3/uL (ref 1.4–7.7)
Neutrophils Relative %: 48.4 % (ref 43.0–77.0)
Platelets: 349 10*3/uL (ref 150.0–400.0)
RBC: 3.88 Mil/uL (ref 3.87–5.11)
RDW: 18 % — ABNORMAL HIGH (ref 11.5–15.5)
WBC: 4.2 10*3/uL (ref 4.0–10.5)

## 2023-06-28 LAB — VITAMIN D 25 HYDROXY (VIT D DEFICIENCY, FRACTURES): VITD: 28.02 ng/mL — ABNORMAL LOW (ref 30.00–100.00)

## 2023-06-28 LAB — VITAMIN B12: Vitamin B-12: 395 pg/mL (ref 211–911)

## 2023-06-29 LAB — IRON,TIBC AND FERRITIN PANEL
%SAT: 9 % — ABNORMAL LOW (ref 16–45)
Ferritin: 2 ng/mL — ABNORMAL LOW (ref 16–232)
Iron: 36 ug/dL — ABNORMAL LOW (ref 45–160)
TIBC: 400 ug/dL (ref 250–450)

## 2023-06-30 NOTE — Addendum Note (Signed)
Addended by: Michaela Corner on: 06/30/2023 03:14 PM   Modules accepted: Orders

## 2023-07-06 ENCOUNTER — Encounter (INDEPENDENT_AMBULATORY_CARE_PROVIDER_SITE_OTHER): Payer: 59 | Admitting: Nurse Practitioner

## 2023-07-06 DIAGNOSIS — M2550 Pain in unspecified joint: Secondary | ICD-10-CM

## 2023-07-07 DIAGNOSIS — M2550 Pain in unspecified joint: Secondary | ICD-10-CM | POA: Diagnosis not present

## 2023-07-07 NOTE — Addendum Note (Signed)
Addended by: Alysia Penna L on: 07/07/2023 11:56 AM   Modules accepted: Orders

## 2023-07-08 NOTE — Telephone Encounter (Signed)
Please see the MyChart message reply(ies) for my assessment and plan.  The patient gave consent for this Medical Advice Message and is aware that it may result in a bill to their insurance company as well as the possibility that this may result in a co-payment or deductible. They are an established patient, but are not seeking medical advice exclusively about a problem treated during an in person or video visit in the last 7 days. I did not recommend an in person or video visit within 7 days of my reply.  I spent a total of 10 minutes cumulative time within 7 days through MyChart messaging Charlotte Nche, NP  

## 2023-07-17 ENCOUNTER — Other Ambulatory Visit: Payer: Self-pay | Admitting: Nurse Practitioner

## 2023-07-17 DIAGNOSIS — F5081 Binge eating disorder: Secondary | ICD-10-CM

## 2023-07-18 ENCOUNTER — Other Ambulatory Visit: Payer: Self-pay | Admitting: Nurse Practitioner

## 2023-07-18 DIAGNOSIS — F5081 Binge eating disorder: Secondary | ICD-10-CM

## 2023-09-14 ENCOUNTER — Other Ambulatory Visit (INDEPENDENT_AMBULATORY_CARE_PROVIDER_SITE_OTHER): Payer: 59

## 2023-09-14 ENCOUNTER — Encounter: Payer: Self-pay | Admitting: Nurse Practitioner

## 2023-09-14 DIAGNOSIS — M2550 Pain in unspecified joint: Secondary | ICD-10-CM | POA: Diagnosis not present

## 2023-09-14 DIAGNOSIS — M0580 Other rheumatoid arthritis with rheumatoid factor of unspecified site: Secondary | ICD-10-CM

## 2023-09-14 DIAGNOSIS — R768 Other specified abnormal immunological findings in serum: Secondary | ICD-10-CM

## 2023-09-14 DIAGNOSIS — Z006 Encounter for examination for normal comparison and control in clinical research program: Secondary | ICD-10-CM

## 2023-09-14 DIAGNOSIS — D508 Other iron deficiency anemias: Secondary | ICD-10-CM

## 2023-09-14 LAB — TSH: TSH: 1.8 u[IU]/mL (ref 0.35–5.50)

## 2023-09-14 LAB — CK: Total CK: 76 U/L (ref 7–177)

## 2023-09-15 LAB — ARTHRITIS PANEL
Anti Nuclear Antibody (ANA): POSITIVE — AB
Rheumatoid fact SerPl-aCnc: 20.9 [IU]/mL — ABNORMAL HIGH (ref <14.0)
Sed Rate: 20 mm/h (ref 0–40)
Uric Acid: 4.2 mg/dL (ref 3.0–7.2)

## 2023-09-15 LAB — IRON,TIBC AND FERRITIN PANEL
%SAT: 13 % — ABNORMAL LOW (ref 16–45)
Ferritin: 6 ng/mL — ABNORMAL LOW (ref 16–232)
Iron: 56 ug/dL (ref 45–160)
TIBC: 440 ug/dL (ref 250–450)

## 2023-09-17 NOTE — Addendum Note (Signed)
Addended by: Alysia Penna L on: 09/17/2023 05:15 PM   Modules accepted: Orders

## 2023-09-19 ENCOUNTER — Encounter: Payer: Self-pay | Admitting: Nurse Practitioner

## 2023-09-23 ENCOUNTER — Encounter: Payer: Self-pay | Admitting: Nurse Practitioner

## 2023-09-23 DIAGNOSIS — R768 Other specified abnormal immunological findings in serum: Secondary | ICD-10-CM | POA: Insufficient documentation

## 2023-09-23 DIAGNOSIS — M059 Rheumatoid arthritis with rheumatoid factor, unspecified: Secondary | ICD-10-CM | POA: Insufficient documentation

## 2023-09-23 DIAGNOSIS — M2550 Pain in unspecified joint: Secondary | ICD-10-CM | POA: Insufficient documentation

## 2023-09-23 DIAGNOSIS — M069 Rheumatoid arthritis, unspecified: Secondary | ICD-10-CM | POA: Insufficient documentation

## 2023-09-23 MED ORDER — PREDNISONE 20 MG PO TABS: ORAL_TABLET | ORAL | 0 refills | Status: AC

## 2023-09-25 ENCOUNTER — Other Ambulatory Visit: Payer: Self-pay | Admitting: Oncology

## 2023-09-25 DIAGNOSIS — D500 Iron deficiency anemia secondary to blood loss (chronic): Secondary | ICD-10-CM

## 2023-09-25 NOTE — Progress Notes (Signed)
[facility]  [address] [place],  [place]  [address] [phone]  Clinic Day:  09/26/2023  Referring physician: Anne Ng, NP   HISTORY OF PRESENT ILLNESS:  The patient is a 52 y.o. female  who I was asked to consult upon for iron deficiency anemia.  Recent labs showed a low hemoglobin of 9.6, with a low MCV of 79.6.  Iron studies done recently showed a low ferritin of 6, a serum iron of 56, a TIBC of 440, and a low iron saturation of 13%.  According to the patient, she had gastric bypass surgery 10 years ago.  She claims her last menstrual cycle was 10 months ago.  She admits to being more exhausted and having headaches over these past few months.  Over these past months, she has tried doubling her iron pills, as well as take vitamin C.  However, these interventions have not led to her iron and hemoglobin improving.  The patient denies having undergone the colonoscopy.  However, she claims Cologuard testing came back negative.  Of note, she does take monthly B12 injections.  PAST MEDICAL HISTORY:   Past Medical History:  Diagnosis Date   Allergy    Arthritis    Asthma    GERD (gastroesophageal reflux disease)    Hypertension     PAST SURGICAL HISTORY:   Past Surgical History:  Procedure Laterality Date   BUNIONECTOMY Left    CYST EXCISION     SKIN OF RIGHT UPPER BACK   MENISCUS REPAIR Bilateral    2006 and 2008   roux n y  06/26/2012   gastric by pass    CURRENT MEDICATIONS:   Current Outpatient Medications  Medication Sig Dispense Refill   acetaminophen (TYLENOL) 500 MG tablet Take 1 tablet (500 mg total) by mouth every 8 (eight) hours as needed. 30 tablet 0   albuterol (VENTOLIN HFA) 108 (90 Base) MCG/ACT inhaler Inhale 1-2 puffs into the lungs every 6 (six) hours as needed for shortness of breath or wheezing. 1 each 3   Alpha-Lipoic Acid 600 MG CAPS Take 1 capsule by mouth 2 (two) times daily.     buPROPion ER  (WELLBUTRIN SR) 100 MG 12 hr tablet TAKE 1 TABLET DAILY X 1WEEK, THEN 1 TABLET 2 TIMES DAILY CONTINUOUSLY 180 tablet 2   calcium citrate-vitamin D (CITRACAL+D) 315-200 MG-UNIT per tablet Take by mouth.     Cholecalciferol 25 MCG (1000 UT) tablet Take 1,000 Units by mouth 1 day or 1 dose.     Cyanocobalamin (VITAMIN B-12 CR) 1000 MCG TBCR Take 2,500 mcg by mouth 2 (two) times daily.      FLUoxetine (PROZAC) 20 MG capsule Take 20 mg by mouth daily at 12 noon.     levonorgestrel (MIRENA, 52 MG,) 20 MCG/24HR IUD Mirena 20 mcg/24 hours (6 yrs) 52 mg intrauterine device  Take 1 device by intrauterine route.     methocarbamol (ROBAXIN) 500 MG tablet Take 1 tablet (500 mg total) by mouth 4 (four) times daily. 30 tablet 0   multivitamin-iron-minerals-folic acid (CENTRUM) chewable tablet Frequency:BID   Dosage:0.0     Instructions:  Note:Dose: 1     omeprazole (PRILOSEC) 20 MG capsule TAKE 1 CAPSULE DAILY 90 capsule 3   topiramate (TOPAMAX) 50 MG tablet Take 50 mg by mouth 1 day or 1 dose.     No current facility-administered medications for this visit.    ALLERGIES:   Allergies  Allergen Reactions   Codeine  Egg-Derived Products     Other reaction(s): SWELLING Other reaction(s): SWELLING   Kiwi Extract    Nsaids     Had gastric bypass--cant take it.    Other     No blind NG tube and no Sugar--doesn't react well   Tolmetin     Had gastric bypass--cant take it.     FAMILY HISTORY:   Family History  Problem Relation Age of Onset   Cancer Mother 69       uterine   Arthritis Mother    Hypertension Mother    Hyperlipidemia Mother    Diabetes Mother    Antithrombin III deficiency Mother    Obesity Mother    Hypertension Father    Hyperlipidemia Father    Diabetes Father    Arthritis Father    Kidney disease Father    Obesity Brother    Cancer Maternal Aunt 89       melanoma with mets to uterine, kidney, lung, brain   Cancer Maternal Uncle 60       stomach   Antithrombin III  deficiency Maternal Uncle    Arthritis Maternal Grandmother    Cancer Maternal Grandmother 33       liver   Arthritis Maternal Grandfather    Stroke Maternal Grandfather    Arthritis Paternal Grandmother    Diabetes Paternal Grandmother    Arthritis Paternal Grandfather     SOCIAL HISTORY:  The patient was born and raised in [place].  She currently lives in [place].  She is engaged; she has no children.  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  She did smoke a pack of cigarettes daily for 20 years before quitting 18 years ago.  She denies having any alcohol use in 20 years.  REVIEW OF SYSTEMS:  Review of Systems  Constitutional:  Positive for fatigue and fever.  HENT:   Negative for hearing loss and sore throat.   Eyes:  Negative for eye problems.  Respiratory:  Positive for shortness of breath. Negative for chest tightness, cough and hemoptysis.   Cardiovascular:  Positive for chest pain. Negative for palpitations.  Gastrointestinal:  Negative for abdominal distention, abdominal pain, blood in stool, constipation, diarrhea, nausea and vomiting.  Endocrine: Negative for hot flashes.  Genitourinary:  Negative for difficulty urinating, dysuria, frequency, hematuria and nocturia.   Musculoskeletal:  Positive for arthralgias and back pain. Negative for gait problem and myalgias.  Skin: Negative.  Negative for itching and rash.  Neurological:  Positive for headaches. Negative for dizziness, extremity weakness, gait problem, light-headedness and numbness.  Hematological: Negative.   Psychiatric/Behavioral:  Positive for depression. Negative for suicidal ideas. The patient is nervous/anxious.     PHYSICAL EXAM:  Blood pressure 136/78, pulse (!) 57, temperature 98.4 F (36.9 C), resp. rate 16, height 5\' 2"  (1.575 m), weight 192 lb 8 oz (87.3 kg), SpO2 100%. Wt Readings from Last 3 Encounters:  09/26/23 192 lb 8 oz (87.3 kg)  06/21/23 192 lb (87.1 kg)  03/22/23 186 lb  (84.4 kg)   Body mass index is 35.21 kg/m. Performance status (ECOG): 0 - Asymptomatic Physical Exam Constitutional:      Appearance: Normal appearance. She is not ill-appearing.  HENT:     Mouth/Throat:     Mouth: Mucous membranes are moist.     Pharynx: Oropharynx is clear. No oropharyngeal exudate or posterior oropharyngeal erythema.  Cardiovascular:     Rate and Rhythm: Normal rate and regular rhythm.  Heart sounds: No murmur heard.    No friction rub. No gallop.  Pulmonary:     Effort: Pulmonary effort is normal. No respiratory distress.     Breath sounds: Normal breath sounds. No wheezing, rhonchi or rales.  Abdominal:     General: Bowel sounds are normal. There is no distension.     Palpations: Abdomen is soft. There is no mass.     Tenderness: There is no abdominal tenderness.  Musculoskeletal:        General: No swelling.     Right lower leg: No edema.     Left lower leg: No edema.  Lymphadenopathy:     Cervical: No cervical adenopathy.     Upper Body:     Right upper body: No supraclavicular or axillary adenopathy.     Left upper body: No supraclavicular or axillary adenopathy.     Lower Body: No right inguinal adenopathy. No left inguinal adenopathy.  Skin:    General: Skin is warm.     Coloration: Skin is not jaundiced.     Findings: No lesion or rash.  Neurological:     General: No focal deficit present.     Mental Status: She is alert and oriented to person, place, and time. Mental status is at baseline.  Psychiatric:        Mood and Affect: Mood normal.        Behavior: Behavior normal.        Thought Content: Thought content normal.     LABS:      Latest Ref Rng & Units 09/26/2023    3:25 PM 06/28/2023    9:45 AM 03/22/2023    1:08 PM  CBC  WBC 4.0 - 10.5 K/uL 7.6  4.2  5.0   Hemoglobin 12.0 - 15.0 g/dL 16.1  9.6  09.6   Hematocrit 36.0 - 46.0 % 33.1  30.8  32.3   Platelets 150 - 400 K/uL 422  349.0  384.0       Latest Ref Rng & Units  09/30/2021    2:24 PM 05/28/2020   12:17 PM 08/21/2019   11:03 AM  CMP  Glucose 70 - 99 mg/dL 045  75  84   BUN 6 - 23 mg/dL 22  14  15    Creatinine 0.40 - 1.20 mg/dL 4.09  8.11  9.14   Sodium 135 - 145 mEq/L 138  136  136   Potassium 3.5 - 5.1 mEq/L 3.6  4.4  3.4   Chloride 96 - 112 mEq/L 106  102  103   CO2 19 - 32 mEq/L 26  29  27    Calcium 8.4 - 10.5 mg/dL 8.9  9.5  8.9   Total Protein 6.0 - 8.3 g/dL 7.1  7.1  6.8   Total Bilirubin 0.2 - 1.2 mg/dL 0.3  0.5  0.6   Alkaline Phos 39 - 117 U/L 44  51  40   AST 0 - 37 U/L 15  15  14    ALT 0 - 35 U/L 9  11  10      Latest Reference Range & Units 09/14/23 09:12 09/26/23 15:24 09/26/23 15:25  Iron 45 - 160 mcg/dL 56    TIBC 782 - 956 mcg/dL (calc) 213    %SAT 16 - 45 % (calc) 13 (L)    Ferritin 16 - 232 ng/mL 6 (L)    Folate >5.9 ng/mL  13.9   Vitamin B12 180 - 914 pg/mL   594  (  L): Data is abnormally low  ASSESSMENT & PLAN:  A 52 y.o. female who I was asked to consult upon for iron deficiency anemia.  Her iron deficiency anemia likely related to her previous gastric bypass surgery, as well as her previous menstrual cycles.  I will arrange for her to receive IV iron over these next few weeks to rapidly replenish her iron stores and normalize her hemoglobin.  I will see her back in 3 months to reassess her iron and hemoglobin levels to see how well she responded to her upcoming IV iron.  The patient understands all the plans discussed today and is in agreement with them.  I do appreciate Nche, Bonna Gains, NP for his new consult.   Decorey Wahlert Kirby Funk, MD

## 2023-09-26 ENCOUNTER — Inpatient Hospital Stay: Payer: 59

## 2023-09-26 ENCOUNTER — Encounter: Payer: Self-pay | Admitting: Oncology

## 2023-09-26 ENCOUNTER — Inpatient Hospital Stay: Payer: 59 | Attending: Oncology | Admitting: Oncology

## 2023-09-26 VITALS — BP 136/78 | HR 57 | Temp 98.4°F | Resp 16 | Ht 62.0 in | Wt 192.5 lb

## 2023-09-26 DIAGNOSIS — D509 Iron deficiency anemia, unspecified: Secondary | ICD-10-CM | POA: Insufficient documentation

## 2023-09-26 DIAGNOSIS — D508 Other iron deficiency anemias: Secondary | ICD-10-CM

## 2023-09-26 DIAGNOSIS — D500 Iron deficiency anemia secondary to blood loss (chronic): Secondary | ICD-10-CM

## 2023-09-26 DIAGNOSIS — Z9884 Bariatric surgery status: Secondary | ICD-10-CM | POA: Insufficient documentation

## 2023-09-26 LAB — CBC WITH DIFFERENTIAL (CANCER CENTER ONLY)
Abs Immature Granulocytes: 0.02 10*3/uL (ref 0.00–0.07)
Basophils Absolute: 0.1 10*3/uL (ref 0.0–0.1)
Basophils Relative: 1 %
Eosinophils Absolute: 0.1 10*3/uL (ref 0.0–0.5)
Eosinophils Relative: 1 %
HCT: 33.1 % — ABNORMAL LOW (ref 36.0–46.0)
Hemoglobin: 10.6 g/dL — ABNORMAL LOW (ref 12.0–15.0)
Immature Granulocytes: 0 %
Lymphocytes Relative: 43 %
Lymphs Abs: 3.2 10*3/uL (ref 0.7–4.0)
MCH: 25.9 pg — ABNORMAL LOW (ref 26.0–34.0)
MCHC: 32 g/dL (ref 30.0–36.0)
MCV: 80.7 fL (ref 80.0–100.0)
Monocytes Absolute: 0.7 10*3/uL (ref 0.1–1.0)
Monocytes Relative: 9 %
Neutro Abs: 3.5 10*3/uL (ref 1.7–7.7)
Neutrophils Relative %: 46 %
Platelet Count: 422 10*3/uL — ABNORMAL HIGH (ref 150–400)
RBC: 4.1 MIL/uL (ref 3.87–5.11)
RDW: 20 % — ABNORMAL HIGH (ref 11.5–15.5)
WBC Count: 7.6 10*3/uL (ref 4.0–10.5)
nRBC: 0 % (ref 0.0–0.2)
nRBC: 0 /100{WBCs}

## 2023-09-26 LAB — FOLATE: Folate: 13.9 ng/mL (ref >5.9)

## 2023-09-26 LAB — VITAMIN B12: Vitamin B-12: 594 pg/mL (ref 180–914)

## 2023-09-27 ENCOUNTER — Telehealth: Payer: Self-pay | Admitting: Oncology

## 2023-09-27 NOTE — Telephone Encounter (Signed)
Contacted pt to schedule additional IV Iron apps, since her insurance approves Venofer. Unable to reach via phone, voicemail was left.

## 2023-09-28 ENCOUNTER — Encounter: Payer: Self-pay | Admitting: Oncology

## 2023-09-28 DIAGNOSIS — D509 Iron deficiency anemia, unspecified: Secondary | ICD-10-CM | POA: Insufficient documentation

## 2023-10-01 ENCOUNTER — Other Ambulatory Visit: Payer: Self-pay | Admitting: Nurse Practitioner

## 2023-10-01 DIAGNOSIS — F50819 Binge eating disorder, unspecified: Secondary | ICD-10-CM

## 2023-10-05 ENCOUNTER — Inpatient Hospital Stay: Payer: 59 | Attending: Oncology

## 2023-10-05 VITALS — BP 136/76 | HR 51 | Temp 98.3°F

## 2023-10-05 DIAGNOSIS — D509 Iron deficiency anemia, unspecified: Secondary | ICD-10-CM | POA: Diagnosis present

## 2023-10-05 DIAGNOSIS — D500 Iron deficiency anemia secondary to blood loss (chronic): Secondary | ICD-10-CM

## 2023-10-05 MED ORDER — IRON SUCROSE 20 MG/ML IV SOLN
200.0000 mg | Freq: Once | INTRAVENOUS | Status: AC
  Administered 2023-10-05: 200 mg via INTRAVENOUS
  Filled 2023-10-05: qty 10

## 2023-10-05 MED ORDER — SODIUM CHLORIDE 0.9% FLUSH
10.0000 mL | Freq: Two times a day (BID) | INTRAVENOUS | Status: DC
Start: 2023-10-05 — End: 2023-10-05
  Administered 2023-10-05: 10 mL via INTRAVENOUS

## 2023-10-05 NOTE — Patient Instructions (Signed)
Iron Sucrose Injection What is this medication? IRON SUCROSE (EYE ern SOO krose) treats low levels of iron (iron deficiency anemia) in people with kidney disease. Iron is a mineral that plays an important role in making red blood cells, which carry oxygen from your lungs to the rest of your body. This medicine may be used for other purposes; ask your health care provider or pharmacist if you have questions. COMMON BRAND NAME(S): Venofer What should I tell my care team before I take this medication? They need to know if you have any of these conditions: Anemia not caused by low iron levels Heart disease High levels of iron in the blood Kidney disease Liver disease An unusual or allergic reaction to iron, other medications, foods, dyes, or preservatives Pregnant or trying to get pregnant Breastfeeding How should I use this medication? This medication is for infusion into a vein. It is given in a hospital or clinic setting. Talk to your care team about the use of this medication in children. While this medication may be prescribed for children as young as 2 years for selected conditions, precautions do apply. Overdosage: If you think you have taken too much of this medicine contact a poison control center or emergency room at once. NOTE: This medicine is only for you. Do not share this medicine with others. What if I miss a dose? Keep appointments for follow-up doses. It is important not to miss your dose. Call your care team if you are unable to keep an appointment. What may interact with this medication? Do not take this medication with any of the following: Deferoxamine Dimercaprol Other iron products This medication may also interact with the following: Chloramphenicol Deferasirox This list may not describe all possible interactions. Give your health care provider a list of all the medicines, herbs, non-prescription drugs, or dietary supplements you use. Also tell them if you smoke,  drink alcohol, or use illegal drugs. Some items may interact with your medicine. What should I watch for while using this medication? Visit your care team regularly. Tell your care team if your symptoms do not start to get better or if they get worse. You may need blood work done while you are taking this medication. You may need to follow a special diet. Talk to your care team. Foods that contain iron include: whole grains/cereals, dried fruits, beans, or peas, leafy green vegetables, and organ meats (liver, kidney). What side effects may I notice from receiving this medication? Side effects that you should report to your care team as soon as possible: Allergic reactions--skin rash, itching, hives, swelling of the face, lips, tongue, or throat Low blood pressure--dizziness, feeling faint or lightheaded, blurry vision Shortness of breath Side effects that usually do not require medical attention (report to your care team if they continue or are bothersome): Flushing Headache Joint pain Muscle pain Nausea Pain, redness, or irritation at injection site This list may not describe all possible side effects. Call your doctor for medical advice about side effects. You may report side effects to FDA at 1-800-FDA-1088. Where should I keep my medication? This medication is given in a hospital or clinic. It will not be stored at home. NOTE: This sheet is a summary. It may not cover all possible information. If you have questions about this medicine, talk to your doctor, pharmacist, or health care provider.  2024 Elsevier/Gold Standard (2023-03-25 00:00:00)

## 2023-10-10 ENCOUNTER — Encounter: Payer: Self-pay | Admitting: Oncology

## 2023-10-11 ENCOUNTER — Inpatient Hospital Stay: Payer: 59

## 2023-10-11 VITALS — BP 109/60 | HR 50 | Temp 98.0°F | Resp 16 | Wt 194.0 lb

## 2023-10-11 DIAGNOSIS — D500 Iron deficiency anemia secondary to blood loss (chronic): Secondary | ICD-10-CM

## 2023-10-11 DIAGNOSIS — D509 Iron deficiency anemia, unspecified: Secondary | ICD-10-CM | POA: Diagnosis not present

## 2023-10-11 MED ORDER — SODIUM CHLORIDE 0.9% FLUSH
10.0000 mL | Freq: Two times a day (BID) | INTRAVENOUS | Status: DC
  Administered 2023-10-11: 10 mL via INTRAVENOUS

## 2023-10-11 MED ORDER — IRON SUCROSE 20 MG/ML IV SOLN
200.0000 mg | Freq: Once | INTRAVENOUS | Status: AC
Start: 2023-10-11 — End: 2023-10-11
  Administered 2023-10-11: 200 mg via INTRAVENOUS
  Filled 2023-10-11: qty 10

## 2023-10-11 NOTE — Patient Instructions (Signed)
Iron Sucrose Injection What is this medication? IRON SUCROSE (EYE ern SOO krose) treats low levels of iron (iron deficiency anemia) in people with kidney disease. Iron is a mineral that plays an important role in making red blood cells, which carry oxygen from your lungs to the rest of your body. This medicine may be used for other purposes; ask your health care provider or pharmacist if you have questions. COMMON BRAND NAME(S): Venofer What should I tell my care team before I take this medication? They need to know if you have any of these conditions: Anemia not caused by low iron levels Heart disease High levels of iron in the blood Kidney disease Liver disease An unusual or allergic reaction to iron, other medications, foods, dyes, or preservatives Pregnant or trying to get pregnant Breastfeeding How should I use this medication? This medication is for infusion into a vein. It is given in a hospital or clinic setting. Talk to your care team about the use of this medication in children. While this medication may be prescribed for children as young as 2 years for selected conditions, precautions do apply. Overdosage: If you think you have taken too much of this medicine contact a poison control center or emergency room at once. NOTE: This medicine is only for you. Do not share this medicine with others. What if I miss a dose? Keep appointments for follow-up doses. It is important not to miss your dose. Call your care team if you are unable to keep an appointment. What may interact with this medication? Do not take this medication with any of the following: Deferoxamine Dimercaprol Other iron products This medication may also interact with the following: Chloramphenicol Deferasirox This list may not describe all possible interactions. Give your health care provider a list of all the medicines, herbs, non-prescription drugs, or dietary supplements you use. Also tell them if you smoke,  drink alcohol, or use illegal drugs. Some items may interact with your medicine. What should I watch for while using this medication? Visit your care team regularly. Tell your care team if your symptoms do not start to get better or if they get worse. You may need blood work done while you are taking this medication. You may need to follow a special diet. Talk to your care team. Foods that contain iron include: whole grains/cereals, dried fruits, beans, or peas, leafy green vegetables, and organ meats (liver, kidney). What side effects may I notice from receiving this medication? Side effects that you should report to your care team as soon as possible: Allergic reactions--skin rash, itching, hives, swelling of the face, lips, tongue, or throat Low blood pressure--dizziness, feeling faint or lightheaded, blurry vision Shortness of breath Side effects that usually do not require medical attention (report to your care team if they continue or are bothersome): Flushing Headache Joint pain Muscle pain Nausea Pain, redness, or irritation at injection site This list may not describe all possible side effects. Call your doctor for medical advice about side effects. You may report side effects to FDA at 1-800-FDA-1088. Where should I keep my medication? This medication is given in a hospital or clinic. It will not be stored at home. NOTE: This sheet is a summary. It may not cover all possible information. If you have questions about this medicine, talk to your doctor, pharmacist, or health care provider.  2024 Elsevier/Gold Standard (2023-03-25 00:00:00)

## 2023-10-17 ENCOUNTER — Telehealth: Payer: Self-pay | Admitting: Nurse Practitioner

## 2023-10-17 NOTE — Telephone Encounter (Signed)
[facility] called stating they received referral but no office notes. Please send notes

## 2023-10-17 NOTE — Telephone Encounter (Signed)
resent

## 2023-10-18 ENCOUNTER — Inpatient Hospital Stay: Payer: 59

## 2023-10-18 VITALS — BP 135/79 | HR 66 | Temp 97.7°F | Resp 18 | Ht 62.0 in | Wt 195.1 lb

## 2023-10-18 DIAGNOSIS — D509 Iron deficiency anemia, unspecified: Secondary | ICD-10-CM | POA: Diagnosis not present

## 2023-10-18 DIAGNOSIS — D500 Iron deficiency anemia secondary to blood loss (chronic): Secondary | ICD-10-CM

## 2023-10-18 MED ORDER — IRON SUCROSE 20 MG/ML IV SOLN
200.0000 mg | Freq: Once | INTRAVENOUS | Status: AC
  Administered 2023-10-18: 200 mg via INTRAVENOUS
  Filled 2023-10-18: qty 10

## 2023-10-18 MED ORDER — SODIUM CHLORIDE 0.9% FLUSH
10.0000 mL | Freq: Two times a day (BID) | INTRAVENOUS | Status: DC
  Administered 2023-10-18: 10 mL via INTRAVENOUS

## 2023-10-18 NOTE — Patient Instructions (Signed)
Iron Sucrose Injection What is this medication? IRON SUCROSE (EYE ern SOO krose) treats low levels of iron (iron deficiency anemia) in people with kidney disease. Iron is a mineral that plays an important role in making red blood cells, which carry oxygen from your lungs to the rest of your body. This medicine may be used for other purposes; ask your health care provider or pharmacist if you have questions. COMMON BRAND NAME(S): Venofer What should I tell my care team before I take this medication? They need to know if you have any of these conditions: Anemia not caused by low iron levels Heart disease High levels of iron in the blood Kidney disease Liver disease An unusual or allergic reaction to iron, other medications, foods, dyes, or preservatives Pregnant or trying to get pregnant Breastfeeding How should I use this medication? This medication is for infusion into a vein. It is given in a hospital or clinic setting. Talk to your care team about the use of this medication in children. While this medication may be prescribed for children as young as 2 years for selected conditions, precautions do apply. Overdosage: If you think you have taken too much of this medicine contact a poison control center or emergency room at once. NOTE: This medicine is only for you. Do not share this medicine with others. What if I miss a dose? Keep appointments for follow-up doses. It is important not to miss your dose. Call your care team if you are unable to keep an appointment. What may interact with this medication? Do not take this medication with any of the following: Deferoxamine Dimercaprol Other iron products This medication may also interact with the following: Chloramphenicol Deferasirox This list may not describe all possible interactions. Give your health care provider a list of all the medicines, herbs, non-prescription drugs, or dietary supplements you use. Also tell them if you smoke,  drink alcohol, or use illegal drugs. Some items may interact with your medicine. What should I watch for while using this medication? Visit your care team regularly. Tell your care team if your symptoms do not start to get better or if they get worse. You may need blood work done while you are taking this medication. You may need to follow a special diet. Talk to your care team. Foods that contain iron include: whole grains/cereals, dried fruits, beans, or peas, leafy green vegetables, and organ meats (liver, kidney). What side effects may I notice from receiving this medication? Side effects that you should report to your care team as soon as possible: Allergic reactions--skin rash, itching, hives, swelling of the face, lips, tongue, or throat Low blood pressure--dizziness, feeling faint or lightheaded, blurry vision Shortness of breath Side effects that usually do not require medical attention (report to your care team if they continue or are bothersome): Flushing Headache Joint pain Muscle pain Nausea Pain, redness, or irritation at injection site This list may not describe all possible side effects. Call your doctor for medical advice about side effects. You may report side effects to FDA at 1-800-FDA-1088. Where should I keep my medication? This medication is given in a hospital or clinic. It will not be stored at home. NOTE: This sheet is a summary. It may not cover all possible information. If you have questions about this medicine, talk to your doctor, pharmacist, or health care provider.  2024 Elsevier/Gold Standard (2023-03-25 00:00:00)

## 2023-10-21 ENCOUNTER — Inpatient Hospital Stay: Payer: 59

## 2023-10-21 ENCOUNTER — Encounter: Payer: Self-pay | Admitting: Oncology

## 2023-10-21 VITALS — BP 122/71 | HR 64 | Temp 98.0°F | Resp 18

## 2023-10-21 DIAGNOSIS — D509 Iron deficiency anemia, unspecified: Secondary | ICD-10-CM | POA: Diagnosis not present

## 2023-10-21 DIAGNOSIS — D500 Iron deficiency anemia secondary to blood loss (chronic): Secondary | ICD-10-CM

## 2023-10-21 MED ORDER — SODIUM CHLORIDE 0.9% FLUSH
10.0000 mL | Freq: Two times a day (BID) | INTRAVENOUS | Status: DC
  Administered 2023-10-21: 10 mL via INTRAVENOUS

## 2023-10-21 MED ORDER — IRON SUCROSE 20 MG/ML IV SOLN
200.0000 mg | Freq: Once | INTRAVENOUS | Status: AC
  Administered 2023-10-21: 200 mg via INTRAVENOUS
  Filled 2023-10-21: qty 10

## 2023-10-21 NOTE — Patient Instructions (Signed)
Iron Sucrose Injection What is this medication? IRON SUCROSE (EYE ern SOO krose) treats low levels of iron (iron deficiency anemia) in people with kidney disease. Iron is a mineral that plays an important role in making red blood cells, which carry oxygen from your lungs to the rest of your body. This medicine may be used for other purposes; ask your health care provider or pharmacist if you have questions. COMMON BRAND NAME(S): Venofer What should I tell my care team before I take this medication? They need to know if you have any of these conditions: Anemia not caused by low iron levels Heart disease High levels of iron in the blood Kidney disease Liver disease An unusual or allergic reaction to iron, other medications, foods, dyes, or preservatives Pregnant or trying to get pregnant Breastfeeding How should I use this medication? This medication is for infusion into a vein. It is given in a hospital or clinic setting. Talk to your care team about the use of this medication in children. While this medication may be prescribed for children as young as 2 years for selected conditions, precautions do apply. Overdosage: If you think you have taken too much of this medicine contact a poison control center or emergency room at once. NOTE: This medicine is only for you. Do not share this medicine with others. What if I miss a dose? Keep appointments for follow-up doses. It is important not to miss your dose. Call your care team if you are unable to keep an appointment. What may interact with this medication? Do not take this medication with any of the following: Deferoxamine Dimercaprol Other iron products This medication may also interact with the following: Chloramphenicol Deferasirox This list may not describe all possible interactions. Give your health care provider a list of all the medicines, herbs, non-prescription drugs, or dietary supplements you use. Also tell them if you smoke,  drink alcohol, or use illegal drugs. Some items may interact with your medicine. What should I watch for while using this medication? Visit your care team regularly. Tell your care team if your symptoms do not start to get better or if they get worse. You may need blood work done while you are taking this medication. You may need to follow a special diet. Talk to your care team. Foods that contain iron include: whole grains/cereals, dried fruits, beans, or peas, leafy green vegetables, and organ meats (liver, kidney). What side effects may I notice from receiving this medication? Side effects that you should report to your care team as soon as possible: Allergic reactions--skin rash, itching, hives, swelling of the face, lips, tongue, or throat Low blood pressure--dizziness, feeling faint or lightheaded, blurry vision Shortness of breath Side effects that usually do not require medical attention (report to your care team if they continue or are bothersome): Flushing Headache Joint pain Muscle pain Nausea Pain, redness, or irritation at injection site This list may not describe all possible side effects. Call your doctor for medical advice about side effects. You may report side effects to FDA at 1-800-FDA-1088. Where should I keep my medication? This medication is given in a hospital or clinic. It will not be stored at home. NOTE: This sheet is a summary. It may not cover all possible information. If you have questions about this medicine, talk to your doctor, pharmacist, or health care provider.  2024 Elsevier/Gold Standard (2023-03-25 00:00:00)

## 2023-10-31 ENCOUNTER — Ambulatory Visit
Admission: RE | Admit: 2023-10-31 | Discharge: 2023-10-31 | Disposition: A | Discharge status: Home / Self Care | Payer: 59 | Source: Ambulatory Visit | Attending: Obstetrics and Gynecology | Admitting: Obstetrics and Gynecology

## 2023-10-31 ENCOUNTER — Inpatient Hospital Stay: Payer: 59

## 2023-10-31 VITALS — BP 121/62 | HR 67 | Temp 97.9°F | Resp 18

## 2023-10-31 DIAGNOSIS — D500 Iron deficiency anemia secondary to blood loss (chronic): Secondary | ICD-10-CM

## 2023-10-31 DIAGNOSIS — N63 Unspecified lump in unspecified breast: Secondary | ICD-10-CM

## 2023-10-31 DIAGNOSIS — D509 Iron deficiency anemia, unspecified: Secondary | ICD-10-CM | POA: Diagnosis not present

## 2023-10-31 MED ORDER — SODIUM CHLORIDE 0.9% FLUSH
10.0000 mL | Freq: Two times a day (BID) | INTRAVENOUS | Status: DC
  Administered 2023-10-31: 10 mL via INTRAVENOUS

## 2023-10-31 MED ORDER — IRON SUCROSE 20 MG/ML IV SOLN
200.0000 mg | Freq: Once | INTRAVENOUS | Status: AC
  Administered 2023-10-31: 200 mg via INTRAVENOUS
  Filled 2023-10-31: qty 10

## 2023-10-31 NOTE — Patient Instructions (Signed)
Iron Sucrose Injection What is this medication? IRON SUCROSE (EYE ern SOO krose) treats low levels of iron (iron deficiency anemia) in people with kidney disease. Iron is a mineral that plays an important role in making red blood cells, which carry oxygen from your lungs to the rest of your body. This medicine may be used for other purposes; ask your health care provider or pharmacist if you have questions. COMMON BRAND NAME(S): Venofer What should I tell my care team before I take this medication? They need to know if you have any of these conditions: Anemia not caused by low iron levels Heart disease High levels of iron in the blood Kidney disease Liver disease An unusual or allergic reaction to iron, other medications, foods, dyes, or preservatives Pregnant or trying to get pregnant Breastfeeding How should I use this medication? This medication is for infusion into a vein. It is given in a hospital or clinic setting. Talk to your care team about the use of this medication in children. While this medication may be prescribed for children as young as 2 years for selected conditions, precautions do apply. Overdosage: If you think you have taken too much of this medicine contact a poison control center or emergency room at once. NOTE: This medicine is only for you. Do not share this medicine with others. What if I miss a dose? Keep appointments for follow-up doses. It is important not to miss your dose. Call your care team if you are unable to keep an appointment. What may interact with this medication? Do not take this medication with any of the following: Deferoxamine Dimercaprol Other iron products This medication may also interact with the following: Chloramphenicol Deferasirox This list may not describe all possible interactions. Give your health care provider a list of all the medicines, herbs, non-prescription drugs, or dietary supplements you use. Also tell them if you smoke,  drink alcohol, or use illegal drugs. Some items may interact with your medicine. What should I watch for while using this medication? Visit your care team regularly. Tell your care team if your symptoms do not start to get better or if they get worse. You may need blood work done while you are taking this medication. You may need to follow a special diet. Talk to your care team. Foods that contain iron include: whole grains/cereals, dried fruits, beans, or peas, leafy green vegetables, and organ meats (liver, kidney). What side effects may I notice from receiving this medication? Side effects that you should report to your care team as soon as possible: Allergic reactions--skin rash, itching, hives, swelling of the face, lips, tongue, or throat Low blood pressure--dizziness, feeling faint or lightheaded, blurry vision Shortness of breath Side effects that usually do not require medical attention (report to your care team if they continue or are bothersome): Flushing Headache Joint pain Muscle pain Nausea Pain, redness, or irritation at injection site This list may not describe all possible side effects. Call your doctor for medical advice about side effects. You may report side effects to FDA at 1-800-FDA-1088. Where should I keep my medication? This medication is given in a hospital or clinic. It will not be stored at home. NOTE: This sheet is a summary. It may not cover all possible information. If you have questions about this medicine, talk to your doctor, pharmacist, or health care provider.  2024 Elsevier/Gold Standard (2023-03-25 00:00:00)

## 2023-11-14 ENCOUNTER — Telehealth: Payer: 59 | Admitting: Family Medicine

## 2023-11-14 DIAGNOSIS — J209 Acute bronchitis, unspecified: Secondary | ICD-10-CM

## 2023-11-14 MED ORDER — DOXYCYCLINE HYCLATE 100 MG PO TABS: 100.0000 mg | ORAL_TABLET | Freq: Two times a day (BID) | ORAL | 0 refills | Status: AC

## 2023-11-14 MED ORDER — ALBUTEROL SULFATE HFA 108 (90 BASE) MCG/ACT IN AERS: 1.0000 | INHALATION_SPRAY | Freq: Four times a day (QID) | RESPIRATORY_TRACT | 0 refills | Status: DC | PRN

## 2023-11-14 MED ORDER — PROMETHAZINE-DM 6.25-15 MG/5ML PO SYRP: 5.0000 mL | ORAL_SOLUTION | Freq: Four times a day (QID) | ORAL | 0 refills | Status: DC | PRN

## 2023-11-14 NOTE — Progress Notes (Signed)
 Virtual Visit Consent   [name], you are scheduled for a virtual visit with a [facility] provider today. Just as with appointments in the office, your consent must be obtained to participate. Your consent will be active for this visit and any virtual visit you may have with one of our providers in the next 365 days. If you have a MyChart account, a copy of this consent can be sent to you electronically.  As this is a virtual visit, video technology does not allow for your provider to perform a traditional examination. This may limit your provider's ability to fully assess your condition. If your provider identifies any concerns that need to be evaluated in person or the need to arrange testing (such as labs, EKG, etc.), we will make arrangements to do so. Although advances in technology are sophisticated, we cannot ensure that it will always work on either your end or our end. If the connection with a video visit is poor, the visit may have to be switched to a telephone visit. With either a video or telephone visit, we are not always able to ensure that we have a secure connection.  By engaging in this virtual visit, you consent to the provision of healthcare and authorize for your insurance to be billed (if applicable) for the services provided during this visit. Depending on your insurance coverage, you may receive a charge related to this service.  I need to obtain your verbal consent now. Are you willing to proceed with your visit today? Elizabeth Thornton has provided verbal consent on 11/14/2023 for a virtual visit (video or telephone). Chiquita CHRISTELLA Barefoot, NP  Date: 11/14/2023 9:33 AM  Virtual Visit via Video Note   I, Chiquita CHRISTELLA Barefoot, connected with  Elizabeth Thornton  ([id number], 04/06/1971) on 11/14/23 at  9:30 AM EST by a video-enabled telemedicine application and verified that I am speaking with the correct person using two identifiers.  Location: Patient: Virtual Visit Location Patient:  Home Provider: Virtual Visit Location Provider: Home Office   I discussed the limitations of evaluation and management by telemedicine and the availability of in person appointments. The patient expressed understanding and agreed to proceed.    History of Present Illness: Elizabeth Thornton is a 53 y.o. 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-  COVID test: no  Vaccines: not UTD- but reports having flu at start of Oct   Problems:  Patient Active Problem List   Diagnosis Date Noted   Iron  deficiency anemia 09/28/2023   Polyarthralgia 09/23/2023   Rheumatoid factor positive 09/23/2023   DDD (degenerative disc disease), cervical 06/21/2023   Binge eating disorder 06/21/2023   Abnormal weight gain 05/28/2020   GERD (gastroesophageal reflux disease) 10/16/2019   Asthma 08/21/2019   Lipodystrophy 02/18/2017   H/O gastric bypass 02/17/2017   Excess skin of abdominal wall 02/17/2017   History of DVT of lower extremity 08/26/2016   Primary osteoarthritis of left knee 08/26/2016   Fibromyalgia 10/20/2012   Bilateral bunions 10/20/2012   Generalized osteoarthrosis 06/23/2012   Vitamin D  deficiency  06/23/2012    Allergies:  Allergies  Allergen Reactions   Codeine    Egg-Derived Products     Other reaction(s): SWELLING Other reaction(s): SWELLING   Kiwi Extract    Nsaids     Had gastric bypass--cant take it.    Other     No blind NG tube and no Sugar--doesn't react well   Tolmetin     Had gastric bypass--cant take it.    Medications:  Current  Outpatient Medications:    acetaminophen  (TYLENOL ) 500 MG tablet, Take 1 tablet (500 mg total) by mouth every 8 (eight) hours as needed., Disp: 30 tablet, Rfl: 0   albuterol  (VENTOLIN  HFA) 108 (90 Base) MCG/ACT inhaler, Inhale 1-2 puffs into the lungs every 6 (six) hours as needed for shortness of breath or wheezing., Disp: 1 each, Rfl: 3   Alpha-Lipoic Acid 600 MG CAPS, Take 1 capsule by mouth 2 (two) times daily., Disp: , Rfl:    buPROPion  ER (WELLBUTRIN  SR) 100 MG 12 hr tablet, TAKE 1 TABLET DAILY X 1WEEK, THEN 1 TABLET 2 TIMES DAILY CONTINUOUSLY, Disp: 180 tablet, Rfl: 2   calcium citrate-vitamin D  (CITRACAL+D) 315-200 MG-UNIT per tablet, Take by mouth., Disp: , Rfl:    Cholecalciferol 25 MCG (1000 UT) tablet, Take 1,000 Units by mouth 1 day or 1 dose., Disp: , Rfl:    Cyanocobalamin  (VITAMIN B-12 CR) 1000 MCG TBCR, Take 2,500 mcg by mouth 2 (two) times daily. , Disp: , Rfl:    FLUoxetine (PROZAC) 20 MG capsule, Take 20 mg by mouth daily at 12 noon., Disp: , Rfl:    levonorgestrel (MIRENA, 52 MG,) 20 MCG/24HR IUD, Mirena 20 mcg/24 hours (6 yrs) 52 mg intrauterine device  Take 1 device by intrauterine route., Disp: , Rfl:    methocarbamol  (ROBAXIN ) 500 MG tablet, Take 1 tablet (500 mg total) by mouth 4 (four) times daily., Disp: 30 tablet, Rfl: 0   multivitamin-iron -minerals-folic acid  (CENTRUM) chewable tablet, Frequency:BID   Dosage:0.0     Instructions:  Note:Dose: 1, Disp: , Rfl:    omeprazole  (PRILOSEC) 20 MG capsule, TAKE 1 CAPSULE DAILY, Disp: 90 capsule, Rfl: 3   topiramate (TOPAMAX) 50 MG tablet, Take 50 mg by mouth 1 day or 1 dose., Disp: , Rfl:   Observations/Objective: Patient is well-developed, well-nourished in no acute distress.  Resting comfortably  at home.  Head is normocephalic, atraumatic.  No labored breathing.  Speech is clear and coherent with logical content.  Patient is alert and oriented at baseline.    Assessment and Plan:   1. Acute bronchitis, unspecified  organism (Primary)  - doxycycline  (VIBRA -TABS) 100 MG tablet; Take 1 tablet (100 mg total) by mouth 2 (two) times daily for 7 days.  Dispense: 14 tablet; Refill: 0 - albuterol  (VENTOLIN  HFA) 108 (90 Base) MCG/ACT inhaler; Inhale 1-2 puffs into the lungs every 6 (six) hours as needed for shortness of breath or wheezing.  Dispense: 1 each; Refill: 0 - promethazine -dextromethorphan (PROMETHAZINE -DM) 6.25-15 MG/5ML syrup; Take 5 mLs by mouth 4 (four) times daily as needed for cough.  Dispense: 118 mL; Refill: 0   - Take meds as prescribed - Rest voice - Use a cool mist humidifier especially during the winter months when heat dries out the air. - Use saline nose sprays frequently to help soothe nasal passages if they are drying out. - Stay hydrated by drinking plenty of fluids - Keep thermostat turn down low to prevent drying out which can cause a dry cough.  -  For fever or aches or pains- take tylenol  or ibuprofen as directed on bottle             * for fevers greater than 101 orally you may alternate ibuprofen and tylenol  every 3 hours.  If you do not improve you will need a follow up visit in person.                 Reviewed side effects, risks and benefits of medication.    Patient acknowledged agreement and understanding of the plan.   Past Medical, Surgical, Social History, Allergies, and Medications have been Reviewed.     Follow Up Instructions: I discussed the assessment and treatment plan with the patient. The patient was provided an opportunity to ask questions and all were answered. The patient agreed with the plan and demonstrated an understanding of the instructions.  A copy of instructions were sent to the patient via MyChart unless otherwise noted below.    The patient was advised to call back or seek an in-person evaluation if the symptoms worsen or if the condition fails to improve as anticipated.    Chiquita CHRISTELLA Barefoot, NP

## 2023-11-14 NOTE — Patient Instructions (Signed)
 Harlee Maffucci, thank you for joining Chiquita CHRISTELLA Barefoot, NP for today's virtual visit.  While this provider is not your primary care provider (PCP), if your PCP is located in our provider database this encounter information will be shared with them immediately following your visit.   A [facility] MyChart account gives you access to today's visit and all your visits, tests, and labs performed at [facility]  click here if you don't have a [facility] MyChart account or go to [email or web address]  Consent: (Patient) Tiffiny Balint provided verbal consent for this virtual visit at the beginning of the encounter.  Current Medications:  Current Outpatient Medications:    doxycycline  (VIBRA -TABS) 100 MG tablet, Take 1 tablet (100 mg total) by mouth 2 (two) times daily for 7 days., Disp: 14 tablet, Rfl: 0   promethazine -dextromethorphan (PROMETHAZINE -DM) 6.25-15 MG/5ML syrup, Take 5 mLs by mouth 4 (four) times daily as needed for cough., Disp: 118 mL, Rfl: 0   acetaminophen  (TYLENOL ) 500 MG tablet, Take 1 tablet (500 mg total) by mouth every 8 (eight) hours as needed., Disp: 30 tablet, Rfl: 0   albuterol  (VENTOLIN  HFA) 108 (90 Base) MCG/ACT inhaler, Inhale 1-2 puffs into the lungs every 6 (six) hours as needed for shortness of breath or wheezing., Disp: 1 each, Rfl: 0   Alpha-Lipoic Acid 600 MG CAPS, Take 1 capsule by mouth 2 (two) times daily., Disp: , Rfl:    buPROPion  ER (WELLBUTRIN  SR) 100 MG 12 hr tablet, TAKE 1 TABLET DAILY X 1WEEK, THEN 1 TABLET 2 TIMES DAILY CONTINUOUSLY, Disp: 180 tablet, Rfl: 2   calcium citrate-vitamin D  (CITRACAL+D) 315-200 MG-UNIT per tablet, Take by mouth., Disp: , Rfl:    Cholecalciferol 25 MCG (1000 UT) tablet, Take 1,000 Units by mouth 1 day or 1 dose., Disp: , Rfl:    Cyanocobalamin  (VITAMIN B-12 CR) 1000 MCG TBCR, Take 2,500 mcg by mouth 2 (two) times daily. , Disp: , Rfl:    FLUoxetine (PROZAC) 20 MG capsule, Take 20 mg by mouth daily at 12  noon., Disp: , Rfl:    levonorgestrel (MIRENA, 52 MG,) 20 MCG/24HR IUD, Mirena 20 mcg/24 hours (6 yrs) 52 mg intrauterine device  Take 1 device by intrauterine route., Disp: , Rfl:    methocarbamol  (ROBAXIN ) 500 MG tablet, Take 1 tablet (500 mg total) by mouth 4 (four) times daily., Disp: 30 tablet, Rfl: 0   multivitamin-iron -minerals-folic acid  (CENTRUM) chewable tablet, Frequency:BID   Dosage:0.0     Instructions:  Note:Dose: 1, Disp: , Rfl:    omeprazole  (PRILOSEC) 20 MG capsule, TAKE 1 CAPSULE DAILY, Disp: 90 capsule, Rfl: 3   topiramate (TOPAMAX) 50 MG tablet, Take 50 mg by mouth 1 day or 1 dose., Disp: , Rfl:    Medications ordered in this encounter:  Meds ordered this encounter  Medications   doxycycline  (VIBRA -TABS) 100 MG tablet    Sig: Take 1 tablet (100 mg total) by mouth 2 (two) times daily for 7 days.    Dispense:  14 tablet    Refill:  0    Supervising Provider:   LAMPTEY, PHILIP O [[id number]]   albuterol  (VENTOLIN  HFA) 108 (90 Base) MCG/ACT inhaler    Sig: Inhale 1-2 puffs into the lungs every 6 (six) hours as needed for shortness of breath or wheezing.    Dispense:  1 each    Refill:  0    Supervising Provider:   BLAISE ALEENE KIDD [[id number]]   promethazine -dextromethorphan (PROMETHAZINE -DM) 6.25-15 MG/5ML syrup    Sig:  Take 5 mLs by mouth 4 (four) times daily as needed for cough.    Dispense:  118 mL    Refill:  0    Supervising Provider:   BLAISE ALEENE KIDD [[id number]]     *If you need refills on other medications prior to your next appointment, please contact your pharmacy*  Follow-Up: Call back or seek an in-person evaluation if the symptoms worsen or if the condition fails to improve as anticipated.  [facility] [phone]  Other Instructions  - Take meds as prescribed - Rest voice - Use a cool mist humidifier especially during the winter months when heat dries out the air. - Use saline nose sprays frequently to help soothe nasal passages if they  are drying out. - Stay hydrated by drinking plenty of fluids - Keep thermostat turn down low to prevent drying out which can cause a dry cough.  - For fever or aches or pains- take tylenol  or ibuprofen as directed on bottle             * for fevers greater than 101 orally you may alternate ibuprofen and tylenol  every 3 hours.  If you do not improve you will need a follow up visit in person.                  If you have been instructed to have an in-person evaluation today at a local Urgent Care facility, please use the link below. It will take you to a list of all of our available [facility], including address, phone number and hours of operation. Please do not delay care.  [facility]  If you or a family member do not have a primary care provider, use the link below to schedule a visit and establish care. When you choose a [facility] primary care physician or advanced practice provider, you gain a long-term partner in health. Find a Primary Care Provider  Learn more about [facility]'s in-office and virtual care options: [facility] - Get Care Now

## 2023-11-15 ENCOUNTER — Encounter: Payer: Self-pay | Admitting: Nurse Practitioner

## 2023-11-15 LAB — HM DIABETES EYE EXAM

## 2023-11-16 ENCOUNTER — Other Ambulatory Visit: Payer: Self-pay | Admitting: Medical Genetics

## 2023-12-12 ENCOUNTER — Telehealth: Payer: Self-pay | Admitting: Nurse Practitioner

## 2023-12-12 DIAGNOSIS — R768 Other specified abnormal immunological findings in serum: Secondary | ICD-10-CM

## 2023-12-12 NOTE — Assessment & Plan Note (Signed)
 Started on plaquenil Under the care of [facility]

## 2023-12-12 NOTE — Telephone Encounter (Signed)
-----   Message from [facility] M sent at 12/12/2023 10:01 AM EST -----  ----- Message ----- From: Coye Diver Sent: 12/12/2023   9:58 AM EST To: [facility] Team  Please abstract and route to provider.

## 2024-01-06 ENCOUNTER — Encounter: Payer: Self-pay | Admitting: Nurse Practitioner

## 2024-01-06 ENCOUNTER — Other Ambulatory Visit: Payer: Self-pay

## 2024-01-06 ENCOUNTER — Ambulatory Visit: Payer: 59 | Admitting: Nurse Practitioner

## 2024-01-06 VITALS — BP 124/80 | HR 70 | Temp 98.4°F | Wt 204.6 lb

## 2024-01-06 DIAGNOSIS — M797 Fibromyalgia: Secondary | ICD-10-CM | POA: Diagnosis not present

## 2024-01-06 DIAGNOSIS — M2550 Pain in unspecified joint: Secondary | ICD-10-CM | POA: Diagnosis not present

## 2024-01-06 DIAGNOSIS — D500 Iron deficiency anemia secondary to blood loss (chronic): Secondary | ICD-10-CM

## 2024-01-06 DIAGNOSIS — M0579 Rheumatoid arthritis with rheumatoid factor of multiple sites without organ or systems involvement: Secondary | ICD-10-CM | POA: Diagnosis not present

## 2024-01-06 MED ORDER — GABAPENTIN 100 MG PO CAPS: ORAL_CAPSULE | ORAL | 3 refills | Status: DC

## 2024-01-06 MED ORDER — TIZANIDINE HCL 4 MG PO CAPS: 4.0000 mg | ORAL_CAPSULE | Freq: Every day | ORAL | 1 refills | Status: DC

## 2024-01-06 NOTE — Assessment & Plan Note (Signed)
 Persistent generalized joint and muscle aches. Start plaquenil 57month ago, no improvement at this time Also take tylenol 650mg  3tabs BID 3x/week with some relief Previous use of gabapentin 300mg  at hs 52yrs ago. She will like to resume med for pain management. Unable to use NSAIDs due to s/p gastric bypass.  Advised to decreased tylenol dose to no more than 3000mg  daily. Sent gabapentin 100mg  at hs, will titrate up to 300mg  at hs over the next 3weeks. Also sent zanaflex 4mg  at hs due to intermittent muscle spasm.

## 2024-01-06 NOTE — Progress Notes (Signed)
 Established Patient Visit  Patient: Elizabeth Thornton   DOB: [birth year]   53 y.o. Female  MRN: [id number] Visit Date: 01/06/2024  Subjective:    Chief Complaint  Patient presents with   Medical Management of Chronic Issues    re-start Gabapentin 300mg  daily for pain management    HPI RA (rheumatoid arthritis) (HCC) Persistent generalized joint and muscle aches. Start plaquenil 69month ago, no improvement at this time Also take tylenol 650mg  3tabs BID 3x/week with some relief Previous use of gabapentin 300mg  at hs 83yrs ago. She will like to resume med for pain management. Unable to use NSAIDs due to s/p gastric bypass.  Advised to decreased tylenol dose to no more than 3000mg  daily. Sent gabapentin 100mg  at hs, will titrate up to 300mg  at hs over the next 3weeks. Also sent zanaflex 4mg  at hs due to intermittent muscle spasm.  Wt Readings from Last 3 Encounters:  01/06/24 204 lb 9.6 oz (92.8 kg)  10/18/23 195 lb 1.9 oz (88.5 kg)  10/11/23 194 lb (88 kg)    Reviewed medical, surgical, and social history today  Medications: Outpatient Medications Prior to Visit  Medication Sig   acetaminophen (TYLENOL) 500 MG tablet Take 1 tablet (500 mg total) by mouth every 8 (eight) hours as needed.   buPROPion ER (WELLBUTRIN SR) 100 MG 12 hr tablet TAKE 1 TABLET DAILY X 1WEEK, THEN 1 TABLET 2 TIMES DAILY CONTINUOUSLY   calcium citrate-vitamin D (CITRACAL+D) 315-200 MG-UNIT per tablet Take by mouth.   Cholecalciferol 25 MCG (1000 UT) tablet Take 1,000 Units by mouth 1 day or 1 dose.   Cyanocobalamin (VITAMIN B-12 CR) 1000 MCG TBCR Take 2,500 mcg by mouth 2 (two) times daily.    FLUoxetine (PROZAC) 20 MG capsule Take 20 mg by mouth daily at 12 noon.   hydroxychloroquine (PLAQUENIL) 200 MG tablet Take 200 mg by mouth daily.   levonorgestrel (MIRENA, 52 MG,) 20 MCG/24HR IUD Mirena 20 mcg/24 hours (6 yrs) 52 mg intrauterine device  Take 1 device by intrauterine route.    multivitamin-iron-minerals-folic acid (CENTRUM) chewable tablet Frequency:BID   Dosage:0.0     Instructions:  Note:Dose: 1   promethazine-dextromethorphan (PROMETHAZINE-DM) 6.25-15 MG/5ML syrup Take 5 mLs by mouth 4 (four) times daily as needed for cough.   topiramate (TOPAMAX) 50 MG tablet Take 50 mg by mouth 1 day or 1 dose.   [DISCONTINUED] albuterol (VENTOLIN HFA) 108 (90 Base) MCG/ACT inhaler Inhale 1-2 puffs into the lungs every 6 (six) hours as needed for shortness of breath or wheezing. (Patient not taking: Reported on 01/06/2024)   [DISCONTINUED] omeprazole (PRILOSEC) 20 MG capsule TAKE 1 CAPSULE DAILY (Patient not taking: Reported on 01/06/2024)   No facility-administered medications prior to visit.   Reviewed past medical and social history.   ROS per HPI above      Objective:  BP 124/80   Pulse 70   Temp 98.4 F (36.9 C) (Temporal)   Wt 204 lb 9.6 oz (92.8 kg)   SpO2 99%   BMI 37.42 kg/m      Physical Exam Cardiovascular:     Rate and Rhythm: Normal rate and regular rhythm.     Pulses: Normal pulses.     Heart sounds: Normal heart sounds.  Pulmonary:     Effort: Pulmonary effort is normal.     Breath sounds: Normal breath sounds.  Musculoskeletal:        General: Tenderness  present. No swelling or deformity.     Right lower leg: No edema.     Left lower leg: No edema.  Neurological:     Mental Status: She is alert and oriented to person, place, and time.     No results found for any visits on 01/06/24.    Assessment & Plan:    Problem List Items Addressed This Visit     Fibromyalgia - Primary   Relevant Medications   gabapentin (NEURONTIN) 100 MG capsule   tiZANidine (ZANAFLEX) 4 MG capsule   Polyarthralgia   Relevant Medications   gabapentin (NEURONTIN) 100 MG capsule   tiZANidine (ZANAFLEX) 4 MG capsule   RA (rheumatoid arthritis) (HCC)   Persistent generalized joint and muscle aches. Start plaquenil 21month ago, no improvement at this time Also  take tylenol 650mg  3tabs BID 3x/week with some relief Previous use of gabapentin 300mg  at hs 69yrs ago. She will like to resume med for pain management. Unable to use NSAIDs due to s/p gastric bypass.  Advised to decreased tylenol dose to no more than 3000mg  daily. Sent gabapentin 100mg  at hs, will titrate up to 300mg  at hs over the next 3weeks. Also sent zanaflex 4mg  at hs due to intermittent muscle spasm.      Relevant Medications   tiZANidine (ZANAFLEX) 4 MG capsule   Advised to schedule appointment for repeat colonoscopy and cervical cancer screen.  Return in about 5 months (around 06/07/2024) for CPE (fasting).     Alysia Penna, NP

## 2024-01-08 NOTE — Progress Notes (Unsigned)
 [facility]  [address] [place],  [place]  [address] [phone]  Clinic Day:  01/09/2024  Referring physician: Anne Ng, NP   HISTORY OF PRESENT ILLNESS:  The patient is a 53 y.o. female  who I recently began seeing for iron deficiency anemia, likely related to her previous gastric bypass.  She comes in today to reassess her anemia after receiving IV iron in December 2024.  Overall, she claims to be feeling better.  She continues to deny having any overt forms of blood loss to explain her iron deficiency anemia.   PHYSICAL EXAM:  Blood pressure 129/71, pulse (!) 55, temperature 98.1 F (36.7 C), temperature source Oral, resp. rate 16, height 5\' 2"  (1.575 m), weight 204 lb 9.6 oz (92.8 kg), SpO2 100%. Wt Readings from Last 3 Encounters:  01/09/24 204 lb 9.6 oz (92.8 kg)  01/06/24 204 lb 9.6 oz (92.8 kg)  10/18/23 195 lb 1.9 oz (88.5 kg)   Body mass index is 37.42 kg/m. Performance status (ECOG): 0 - Asymptomatic Physical Exam Constitutional:      Appearance: Normal appearance. She is not ill-appearing.  HENT:     Mouth/Throat:     Mouth: Mucous membranes are moist.     Pharynx: Oropharynx is clear. No oropharyngeal exudate or posterior oropharyngeal erythema.  Cardiovascular:     Rate and Rhythm: Normal rate and regular rhythm.     Heart sounds: No murmur heard.    No friction rub. No gallop.  Pulmonary:     Effort: Pulmonary effort is normal. No respiratory distress.     Breath sounds: Normal breath sounds. No wheezing, rhonchi or rales.  Abdominal:     General: Bowel sounds are normal. There is no distension.     Palpations: Abdomen is soft. There is no mass.     Tenderness: There is no abdominal tenderness.  Musculoskeletal:        General: No swelling.     Right lower leg: No edema.     Left lower leg: No edema.  Lymphadenopathy:     Cervical: No cervical adenopathy.     Upper Body:     Right upper body: No  supraclavicular or axillary adenopathy.     Left upper body: No supraclavicular or axillary adenopathy.     Lower Body: No right inguinal adenopathy. No left inguinal adenopathy.  Skin:    General: Skin is warm.     Coloration: Skin is not jaundiced.     Findings: No lesion or rash.  Neurological:     General: No focal deficit present.     Mental Status: She is alert and oriented to person, place, and time. Mental status is at baseline.  Psychiatric:        Mood and Affect: Mood normal.        Behavior: Behavior normal.        Thought Content: Thought content normal.    LABS:      Latest Ref Rng & Units 01/09/2024    8:45 AM 09/26/2023    3:25 PM 06/28/2023    9:45 AM  CBC  WBC 4.0 - 10.5 K/uL 5.2  7.6  4.2   Hemoglobin 12.0 - 15.0 g/dL 78.4  69.6  9.6   Hematocrit 36.0 - 46.0 % 38.6  33.1  30.8   Platelets 150 - 400 K/uL 356  422  349.0       Latest Ref Rng & Units 09/30/2021  2:24 PM 05/28/2020   12:17 PM 08/21/2019   11:03 AM  CMP  Glucose 70 - 99 mg/dL 161  75  84   BUN 6 - 23 mg/dL 22  14  15    Creatinine 0.40 - 1.20 mg/dL 0.96  0.45  4.09   Sodium 135 - 145 mEq/L 138  136  136   Potassium 3.5 - 5.1 mEq/L 3.6  4.4  3.4   Chloride 96 - 112 mEq/L 106  102  103   CO2 19 - 32 mEq/L 26  29  27    Calcium 8.4 - 10.5 mg/dL 8.9  9.5  8.9   Total Protein 6.0 - 8.3 g/dL 7.1  7.1  6.8   Total Bilirubin 0.2 - 1.2 mg/dL 0.3  0.5  0.6   Alkaline Phos 39 - 117 U/L 44  51  40   AST 0 - 37 U/L 15  15  14    ALT 0 - 35 U/L 9  11  10      Latest Reference Range & Units 09/14/23 09:12 01/09/24 08:45  Iron 28 - 170 ug/dL 56 90  UIBC ug/dL  811  TIBC 914 - 782 ug/dL 956 213  %SAT 16 - 45 % (calc) 13 (L)   Saturation Ratios 10.4 - 31.8 %  23  Ferritin 11 - 307 ng/mL 6 (L) 47  (L): Data is abnormally low  ASSESSMENT & PLAN:  A 53 y.o. female with iron deficiency anemia likely related to her previous gastric bypass surgery, as well as her previous menstrual cycles.  I am pleased  with the improvement in her iron and hemoglobin levels since she received her IV iron in December 2024.  Clinically, the patient is doing well.  As that is the case, I will see her back in 6 months for repeat clinical assessment.  The patient understands all the plans discussed today and is in agreement with them.  Muhanad Torosyan Kirby Funk, MD

## 2024-01-09 ENCOUNTER — Inpatient Hospital Stay: Payer: 59

## 2024-01-09 ENCOUNTER — Inpatient Hospital Stay: Payer: 59 | Attending: Oncology | Admitting: Oncology

## 2024-01-09 ENCOUNTER — Telehealth: Payer: Self-pay | Admitting: Oncology

## 2024-01-09 ENCOUNTER — Other Ambulatory Visit: Payer: Self-pay | Admitting: Oncology

## 2024-01-09 VITALS — BP 129/71 | HR 55 | Temp 98.1°F | Resp 16 | Ht 62.0 in | Wt 204.6 lb

## 2024-01-09 DIAGNOSIS — D509 Iron deficiency anemia, unspecified: Secondary | ICD-10-CM | POA: Insufficient documentation

## 2024-01-09 DIAGNOSIS — D508 Other iron deficiency anemias: Secondary | ICD-10-CM | POA: Diagnosis not present

## 2024-01-09 DIAGNOSIS — D500 Iron deficiency anemia secondary to blood loss (chronic): Secondary | ICD-10-CM

## 2024-01-09 DIAGNOSIS — Z9884 Bariatric surgery status: Secondary | ICD-10-CM | POA: Diagnosis not present

## 2024-01-09 LAB — CBC WITH DIFFERENTIAL (CANCER CENTER ONLY)
Abs Immature Granulocytes: 0.02 10*3/uL (ref 0.00–0.07)
Basophils Absolute: 0.1 10*3/uL (ref 0.0–0.1)
Basophils Relative: 1 %
Eosinophils Absolute: 0.1 10*3/uL (ref 0.0–0.5)
Eosinophils Relative: 3 %
HCT: 38.6 % (ref 36.0–46.0)
Hemoglobin: 12.6 g/dL (ref 12.0–15.0)
Immature Granulocytes: 0 %
Lymphocytes Relative: 35 %
Lymphs Abs: 1.8 10*3/uL (ref 0.7–4.0)
MCH: 29.4 pg (ref 26.0–34.0)
MCHC: 32.6 g/dL (ref 30.0–36.0)
MCV: 90 fL (ref 80.0–100.0)
Monocytes Absolute: 0.6 10*3/uL (ref 0.1–1.0)
Monocytes Relative: 11 %
Neutro Abs: 2.6 10*3/uL (ref 1.7–7.7)
Neutrophils Relative %: 50 %
Platelet Count: 356 10*3/uL (ref 150–400)
RBC: 4.29 MIL/uL (ref 3.87–5.11)
RDW: 17.5 % — ABNORMAL HIGH (ref 11.5–15.5)
WBC Count: 5.2 10*3/uL (ref 4.0–10.5)
nRBC: 0 % (ref 0.0–0.2)
nRBC: 0 /100{WBCs}

## 2024-01-09 LAB — IRON AND TIBC
Iron: 90 ug/dL (ref 28–170)
Saturation Ratios: 23 % (ref 10.4–31.8)
TIBC: 385 ug/dL (ref 250–450)
UIBC: 295 ug/dL

## 2024-01-09 LAB — FERRITIN: Ferritin: 47 ng/mL (ref 11–307)

## 2024-01-09 NOTE — Telephone Encounter (Signed)
 Patient has been scheduled for follow-up visit per 01/09/24 LOS.  Pt given an appt calendar with date and time.

## 2024-01-19 ENCOUNTER — Ambulatory Visit: Admitting: Orthopedic Surgery

## 2024-01-19 ENCOUNTER — Encounter: Payer: Self-pay | Admitting: Nurse Practitioner

## 2024-01-19 DIAGNOSIS — M2550 Pain in unspecified joint: Secondary | ICD-10-CM

## 2024-01-19 DIAGNOSIS — M797 Fibromyalgia: Secondary | ICD-10-CM

## 2024-01-20 MED ORDER — GABAPENTIN 300 MG PO CAPS: 300.0000 mg | ORAL_CAPSULE | Freq: Every day | ORAL | 3 refills | Status: AC

## 2024-01-23 ENCOUNTER — Ambulatory Visit: Admitting: Orthopedic Surgery

## 2024-01-25 ENCOUNTER — Encounter: Payer: Self-pay | Admitting: Nurse Practitioner

## 2024-01-27 ENCOUNTER — Encounter: Payer: Self-pay | Admitting: Nurse Practitioner

## 2024-04-23 ENCOUNTER — Encounter (INDEPENDENT_AMBULATORY_CARE_PROVIDER_SITE_OTHER): Payer: Self-pay | Admitting: Nurse Practitioner

## 2024-04-23 DIAGNOSIS — M26621 Arthralgia of right temporomandibular joint: Secondary | ICD-10-CM

## 2024-04-24 DIAGNOSIS — M26621 Arthralgia of right temporomandibular joint: Secondary | ICD-10-CM | POA: Diagnosis not present

## 2024-04-25 MED ORDER — CYCLOBENZAPRINE HCL 10 MG PO TABS: 10.0000 mg | ORAL_TABLET | Freq: Every day | ORAL | 0 refills | Status: DC

## 2024-04-25 NOTE — Addendum Note (Signed)
 Addended by: KATHEEN ROSELIE CROME on: 04/25/2024 04:54 PM   Modules accepted: Orders

## 2024-04-25 NOTE — Telephone Encounter (Signed)
Please see the MyChart message reply(ies) for my assessment and plan.  The patient gave consent for this Medical Advice Message and is aware that it may result in a bill to their insurance company as well as the possibility that this may result in a co-payment or deductible. They are an established patient, but are not seeking medical advice exclusively about a problem treated during an in person or video visit in the last 7 days. I did not recommend an in person or video visit within 7 days of my reply.  I spent a total of 10 minutes cumulative time within 7 days through MyChart messaging Lillith Mcneff, NP  

## 2024-05-14 ENCOUNTER — Encounter: Payer: Self-pay | Admitting: Nurse Practitioner

## 2024-05-14 NOTE — Telephone Encounter (Signed)
 Medication: Fluoxetine (Prozac) 20 mg  Directions: Take 1 table by mouth daily  Last given: Historical provider 06/20/21 Number refills:  Last o/v: 01/06/24 Follow up: 5 months-SCHEDULED for 06/21/24 Labs:

## 2024-05-15 ENCOUNTER — Other Ambulatory Visit: Payer: Self-pay

## 2024-05-15 NOTE — Telephone Encounter (Signed)
 Received a fax from [facility] requesting Tizanidine  HCL 4 mg #90 with 3 refills.   Called patient's pharmacy spoke with Wende and informed her why I was calling. I informed her that the medication was discontinued on 04/25/24 by prescriber for change in therapy. She thanked me for calling and stated that she will update patient's records so that our office does receive any more faxes.

## 2024-05-17 LAB — HM PAP SMEAR

## 2024-06-07 ENCOUNTER — Encounter: Admitting: Nurse Practitioner

## 2024-06-21 ENCOUNTER — Encounter: Admitting: Nurse Practitioner

## 2024-07-11 ENCOUNTER — Other Ambulatory Visit

## 2024-07-11 ENCOUNTER — Ambulatory Visit (INDEPENDENT_AMBULATORY_CARE_PROVIDER_SITE_OTHER): Admitting: Nurse Practitioner

## 2024-07-11 ENCOUNTER — Encounter: Payer: Self-pay | Admitting: Nurse Practitioner

## 2024-07-11 ENCOUNTER — Ambulatory Visit: Admitting: Oncology

## 2024-07-11 VITALS — BP 122/72 | HR 68 | Temp 97.9°F | Ht 62.0 in | Wt 162.6 lb

## 2024-07-11 DIAGNOSIS — Z13220 Encounter for screening for lipoid disorders: Secondary | ICD-10-CM | POA: Diagnosis not present

## 2024-07-11 DIAGNOSIS — M26621 Arthralgia of right temporomandibular joint: Secondary | ICD-10-CM

## 2024-07-11 DIAGNOSIS — E559 Vitamin D deficiency, unspecified: Secondary | ICD-10-CM

## 2024-07-11 DIAGNOSIS — Z136 Encounter for screening for cardiovascular disorders: Secondary | ICD-10-CM

## 2024-07-11 DIAGNOSIS — Z9884 Bariatric surgery status: Secondary | ICD-10-CM

## 2024-07-11 DIAGNOSIS — D508 Other iron deficiency anemias: Secondary | ICD-10-CM

## 2024-07-11 DIAGNOSIS — Z0001 Encounter for general adult medical examination with abnormal findings: Secondary | ICD-10-CM

## 2024-07-11 DIAGNOSIS — M797 Fibromyalgia: Secondary | ICD-10-CM

## 2024-07-11 LAB — LIPID PANEL
Cholesterol: 190 mg/dL (ref 0–200)
HDL: 58.7 mg/dL (ref >39.00)
LDL Cholesterol: 114 mg/dL — ABNORMAL HIGH (ref 0–99)
NonHDL: 130.82
Total CHOL/HDL Ratio: 3
Triglycerides: 86 mg/dL (ref 0.0–149.0)
VLDL: 17.2 mg/dL (ref 0.0–40.0)

## 2024-07-11 LAB — B12 AND FOLATE PANEL
Folate: 15.2 ng/mL (ref >5.9)
Vitamin B-12: 215 pg/mL (ref 211–911)

## 2024-07-11 LAB — VITAMIN D 25 HYDROXY (VIT D DEFICIENCY, FRACTURES): VITD: 40.74 ng/mL (ref 30.00–100.00)

## 2024-07-11 MED ORDER — CYCLOBENZAPRINE HCL 10 MG PO TABS: 10.0000 mg | ORAL_TABLET | Freq: Every evening | ORAL | 2 refills | Status: DC | PRN

## 2024-07-11 NOTE — Patient Instructions (Signed)
 Go to lab Maintain Heart healthy diet and daily exercise. Maintain current medications.

## 2024-07-11 NOTE — Progress Notes (Signed)
 Complete physical exam  Patient: Elizabeth Thornton   DOB: 1971-09-16   53 y.o. Female  MRN: [id number] Visit Date: 07/11/2024  Subjective:    Chief Complaint  Patient presents with   Annual Exam    FASTING  Refill Flexeril  and B12  Requesting PAP smear    Elizabeth Thornton is a 53 y.o. female who presents today for a complete physical exam. She reports consuming a low fat diet. No consistent exercise regimen at this time She generally feels well. She reports sleeping well. She does not have additional problems to discuss today.  Vision:Yes Dental:No STD Screen:No Reviewed CBC, Tsh, hgbA1c and CMP completed by [facility] BP Readings from Last 3 Encounters:  07/11/24 122/72  01/09/24 129/71  01/06/24 124/80   Wt Readings from Last 3 Encounters:  07/11/24 162 lb 9.6 oz (73.8 kg)  01/09/24 204 lb 9.6 oz (92.8 kg)  01/06/24 204 lb 9.6 oz (92.8 kg)   Most recent fall risk assessment:    06/21/2023    2:02 PM  Fall Risk   Falls in the past year? 0  Number falls in past yr: 0  Injury with Fall? 0  Risk for fall due to : No Fall Risks  Follow up Falls evaluation completed     Depression screen:Yes - No Depression Most recent depression screenings:    09/26/2023    4:52 PM 06/21/2023    2:03 PM  PHQ 2/9 Scores  PHQ - 2 Score 0 0  PHQ- 9 Score  3   HPI  No problem-specific Assessment & Plan notes found for this encounter.  Past Medical History:  Diagnosis Date   Allergy    Arthritis    Asthma    GERD (gastroesophageal reflux disease)    Hypertension    Past Surgical History:  Procedure Laterality Date   BUNIONECTOMY Left    CYST EXCISION     SKIN OF RIGHT UPPER BACK   MENISCUS REPAIR Bilateral    2006 and 2008   roux n y  06/26/2012   gastric by pass   Social History   Socioeconomic History   Marital status: Significant Other    Spouse name: Not on file   Number of children: 0   Years of education: 61 + SOME COLLEGE   Highest  education level: Associate degree: occupational, Scientist, product/process development, or vocational program  Occupational History   Not on file  Tobacco Use   Smoking status: Former    Current packs/day: 0.00    Average packs/day: 1.5 packs/day for 15.0 years (22.5 ttl pk-yrs)    Types: Cigarettes    Start date: 02/14/1992    Quit date: 02/14/2007    Years since quitting: 17.4   Smokeless tobacco: Never  Vaping Use   Vaping status: Never Used  Substance and Sexual Activity   Alcohol use: No   Drug use: Never   Sexual activity: Yes    Birth control/protection: I.U.D.    Comment: mirena  Other Topics Concern   Not on file  Social History Narrative   Not on file   Social Drivers of Health   Financial Resource Strain: Low Risk  (07/10/2024)   Overall Financial Resource Strain (CARDIA)    Difficulty of Paying Living Expenses: Not hard at all  Food Insecurity: No Food Insecurity (07/10/2024)   Hunger Vital Sign    Worried About Running Out of Food in the Last Year: Never true    Ran Out of Food in the Last  Year: Never true  Transportation Needs: No Transportation Needs (07/10/2024)   PRAPARE - Administrator, Civil Service (Medical): No    Lack of Transportation (Non-Medical): No  Physical Activity: Insufficiently Active (07/10/2024)   Exercise Vital Sign    Days of Exercise per Week: 2 days    Minutes of Exercise per Session: 30 min  Stress: No Stress Concern Present (07/10/2024)   [employer] of Occupational Health - Occupational Stress Questionnaire    Feeling of Stress: Not at all  Social Connections: Socially Isolated (07/10/2024)   Social Connection and Isolation Panel    Frequency of Communication with Friends and Family: Never    Frequency of Social Gatherings with Friends and Family: Never    Attends Religious Services: Never    Database administrator or Organizations: No    Attends Engineer, structural: Not on file    Marital Status: Married  Catering manager Violence: Not  At Risk (09/26/2023)   Humiliation, Afraid, Rape, and Kick questionnaire    Fear of Current or Ex-Partner: No    Emotionally Abused: No    Physically Abused: No    Sexually Abused: No   Family Status  Relation Name Status   Mother Kameela Michaud Deceased       ovarian   Father Lynwood Duran Alive   Brother Oliva Duran (Not Specified)   Mat Wylie Handing Deceased       breast   Mat Higinio Ren Deceased       lung   MGM E. Doyon Deceased   MGF Roland Doyon Deceased   PGM Johnston Duran Deceased   PGF Cy Deceased  No partnership data on file   Family History  Problem Relation Age of Onset   Cancer Mother 29       uterine   Arthritis Mother    Hypertension Mother    Hyperlipidemia Mother    Diabetes Mother    Antithrombin III deficiency Mother    Obesity Mother    Hypertension Father    Hyperlipidemia Father    Diabetes Father    Arthritis Father    Kidney disease Father    Obesity Brother    Cancer Maternal Aunt 10       melanoma with mets to uterine, kidney, lung, brain   Cancer Maternal Uncle 60       stomach   Antithrombin III deficiency Maternal Uncle    Arthritis Maternal Grandmother    Cancer Maternal Grandmother 16       liver   Arthritis Maternal Grandfather    Stroke Maternal Grandfather    Arthritis Paternal Grandmother    Diabetes Paternal Grandmother    Arthritis Paternal Grandfather    Allergies  Allergen Reactions   Codeine    Egg-Derived Products     Other reaction(s): SWELLING Other reaction(s): SWELLING   Kiwi Extract    Nsaids     Had gastric bypass--cant take it.    Other     No blind NG tube and no Sugar--doesn't react well   Tolmetin     Had gastric bypass--cant take it.     Patient Care Team: Azaria Bartell, Roselie Rockford, NP as PCP - General (Internal Medicine)   Medications: Outpatient Medications Prior to Visit  Medication Sig   acetaminophen  (TYLENOL ) 500 MG tablet Take 1 tablet (500 mg total) by mouth every 8 (eight) hours as  needed.   albuterol  (VENTOLIN  HFA) 108 (90 Base) MCG/ACT inhaler Inhale 1-2 puffs  into the lungs as needed for wheezing or shortness of breath.   buPROPion  ER (WELLBUTRIN  SR) 100 MG 12 hr tablet TAKE 1 TABLET DAILY X 1WEEK, THEN 1 TABLET 2 TIMES DAILY CONTINUOUSLY   calcium citrate-vitamin D  (CITRACAL+D) 315-200 MG-UNIT per tablet Take by mouth.   FLUoxetine (PROZAC) 20 MG capsule Take 20 mg by mouth daily at 12 noon.   gabapentin  (NEURONTIN ) 300 MG capsule Take 1 capsule (300 mg total) by mouth at bedtime.   hydroxychloroquine (PLAQUENIL) 200 MG tablet Take 200 mg by mouth daily.   levonorgestrel (MIRENA, 52 MG,) 20 MCG/24HR IUD Mirena 20 mcg/24 hours (6 yrs) 52 mg intrauterine device  Take 1 device by intrauterine route.   multivitamin-iron -minerals-folic acid  (CENTRUM) chewable tablet Frequency:BID   Dosage:0.0     Instructions:  Note:Dose: 1   tirzepatide (ZEPBOUND) 5 MG/0.5ML injection vial Inject 5 mg into the skin.   topiramate (TOPAMAX) 50 MG tablet Take 50 mg by mouth 1 day or 1 dose.   [DISCONTINUED] cyclobenzaprine  (FLEXERIL ) 10 MG tablet Take 1 tablet (10 mg total) by mouth at bedtime. (Patient taking differently: Take 10 mg by mouth as needed for muscle spasms.)   Cholecalciferol 25 MCG (1000 UT) tablet Take 1,000 Units by mouth 1 day or 1 dose.   [DISCONTINUED] Cyanocobalamin  (VITAMIN B-12 CR) 1000 MCG TBCR Take 2,500 mcg by mouth 2 (two) times daily.    No facility-administered medications prior to visit.    Review of Systems  Constitutional:  Negative for activity change, appetite change and unexpected weight change.  Respiratory: Negative.    Cardiovascular: Negative.   Gastrointestinal: Negative.   Endocrine: Negative for cold intolerance and heat intolerance.  Genitourinary: Negative.   Musculoskeletal: Negative.   Skin: Negative.   Neurological: Negative.   Hematological: Negative.   Psychiatric/Behavioral:  Negative for behavioral problems, decreased concentration,  dysphoric mood, hallucinations, self-injury, sleep disturbance and suicidal ideas. The patient is not nervous/anxious.         Objective:  BP 122/72 (BP Location: Left Arm, Patient Position: Sitting, Cuff Size: Normal)   Pulse 68   Temp 97.9 F (36.6 C) (Oral)   Ht 5' 2 (1.575 m)   Wt 162 lb 9.6 oz (73.8 kg)   SpO2 100%   BMI 29.74 kg/m     Physical Exam Vitals and nursing note reviewed.  Constitutional:      General: She is not in acute distress. HENT:     Right Ear: Tympanic membrane, ear canal and external ear normal.     Left Ear: Tympanic membrane, ear canal and external ear normal.     Nose: Nose normal.  Eyes:     Extraocular Movements: Extraocular movements intact.     Conjunctiva/sclera: Conjunctivae normal.     Pupils: Pupils are equal, round, and reactive to light.  Neck:     Thyroid : No thyroid  mass, thyromegaly or thyroid  tenderness.  Cardiovascular:     Rate and Rhythm: Normal rate and regular rhythm.     Pulses: Normal pulses.     Heart sounds: Normal heart sounds.  Pulmonary:     Effort: Pulmonary effort is normal.     Breath sounds: Normal breath sounds.  Abdominal:     General: Bowel sounds are normal.     Palpations: Abdomen is soft.  Musculoskeletal:        General: Normal range of motion.     Cervical back: Normal range of motion and neck supple.     Right lower leg:  No edema.     Left lower leg: No edema.  Lymphadenopathy:     Cervical: No cervical adenopathy.  Skin:    General: Skin is warm and dry.  Neurological:     Mental Status: She is alert and oriented to person, place, and time.     Cranial Nerves: No cranial nerve deficit.  Psychiatric:        Mood and Affect: Mood normal.        Behavior: Behavior normal.        Thought Content: Thought content normal.      No results found for any visits on 07/11/24.    Assessment & Plan:    Routine Health Maintenance and Physical Exam  Immunization History  Administered Date(s)  Administered   Influenza Inj Mdck Quad Pf 09/05/2019   Influenza, Quadrivalent, Recombinant, Inj, Pf 08/08/2017, 08/15/2018   Influenza,trivalent, recombinat, inj, PF 08/26/2016   Influenza-Unspecified 08/26/2016, 08/08/2017, 08/15/2018   Pneumococcal Conjugate-13 10/07/2016   Pneumococcal-Unspecified 10/07/2016   Tdap 08/26/2016   Zoster Recombinant(Shingrix) 09/30/2021, 12/29/2021    Health Maintenance  Topic Date Due   Colonoscopy  Never done   Cervical Cancer Screening (HPV/Pap Cotest)  01/07/2024   Influenza Vaccine  01/29/2025 (Originally 06/01/2024)   Pneumococcal Vaccine: 50+ Years (2 of 2 - PPSV23, PCV20, or PCV21) 07/11/2025 (Originally 12/02/2016)   Hepatitis B Vaccines 19-59 Average Risk (1 of 3 - 19+ 3-dose series) 07/11/2025 (Originally 06/26/1988)   Hepatitis C Screening  07/11/2025 (Originally 06/27/1987)   HIV Screening  07/11/2025 (Originally 06/26/1984)   MAMMOGRAM  10/30/2025   DTaP/Tdap/Td (2 - Td or Tdap) 08/26/2026   Zoster Vaccines- Shingrix  Completed   HPV VACCINES  Aged Out   Meningococcal B Vaccine  Aged Out   COVID-19 Vaccine  Discontinued   Fecal DNA (Cologuard)  Discontinued    Discussed health benefits of physical activity, and encouraged her to engage in regular exercise appropriate for her age and condition.  Problem List Items Addressed This Visit     Fibromyalgia   Relevant Medications   cyclobenzaprine  (FLEXERIL ) 10 MG tablet   History of Roux-en-Y gastric bypass   Iron  deficiency anemia   Relevant Orders   Iron , TIBC and Ferritin Panel   Vitamin D  deficiency   Relevant Orders   VITAMIN D  25 Hydroxy (Vit-D Deficiency, Fractures)   Other Visit Diagnoses       Encounter for preventative adult health care exam with abnormal findings    -  Primary     Encounter for lipid screening for cardiovascular disease       Relevant Orders   Lipid panel     Arthralgia of right temporomandibular joint       Relevant Medications   cyclobenzaprine   (FLEXERIL ) 10 MG tablet      Return in about 1 year (around 07/11/2025) for CPE (fasting).     Roselie Mood, NP

## 2024-07-12 LAB — IRON,TIBC AND FERRITIN PANEL
%SAT: 24 % (ref 16–45)
Ferritin: 105 ng/mL (ref 16–232)
Iron: 64 ug/dL (ref 45–160)
TIBC: 268 ug/dL (ref 250–450)

## 2024-07-13 ENCOUNTER — Ambulatory Visit: Payer: Self-pay | Admitting: Nurse Practitioner

## 2024-07-13 DIAGNOSIS — M26621 Arthralgia of right temporomandibular joint: Secondary | ICD-10-CM

## 2024-07-13 DIAGNOSIS — R799 Abnormal finding of blood chemistry, unspecified: Secondary | ICD-10-CM

## 2024-07-17 ENCOUNTER — Encounter: Payer: Self-pay | Admitting: Nurse Practitioner

## 2024-07-17 MED ORDER — TIZANIDINE HCL 4 MG PO TABS: 4.0000 mg | ORAL_TABLET | Freq: Every evening | ORAL | 2 refills | Status: DC | PRN

## 2024-07-17 NOTE — Addendum Note (Signed)
 Addended by: KATHEEN GARDEN L on: 07/17/2024 01:13 PM   Modules accepted: Orders

## 2024-07-18 ENCOUNTER — Telehealth: Payer: Self-pay | Admitting: Nurse Practitioner

## 2024-07-18 DIAGNOSIS — Z1211 Encounter for screening for malignant neoplasm of colon: Secondary | ICD-10-CM

## 2024-07-18 NOTE — Telephone Encounter (Signed)
 A GI referral was placed last year in August for colonoscopy. This was not scheduled. The patient stated, she was told records from the bariatric clinic was needed prior to scheduling.  I tried to call the [facility] office twice to in order reason for bariatric clinic notes. I called [phone], was transferred to the referral team, but the phone rings continuously and no response.

## 2024-07-25 NOTE — Telephone Encounter (Signed)
 Talked to [facility] administrator about 2024 GI referral which was not scheduled. Reason stated records from [facility] was needed. I entered a new referral because the previous referral was closed. I asked why [facility] records were needed if patient did not have a procedure by [facility]. Also all [facility] records can be viewed via care everywhere. Elizabeth Thornton agreed that patient's records from [facility] can be viewed via care everywhere. She also stated there is an encounter for an upper endoscopy on 01/04/2022 by [facility]. A [facility] provider needs to review these records and give an ok for the patient to be scheduled. She stated that is the protocol put in place by the providers in that office. She stated with new referral order, a provider will review Elizabeth Thornton's records and she will to contacted.

## 2024-07-26 ENCOUNTER — Other Ambulatory Visit: Payer: Self-pay | Admitting: Nurse Practitioner

## 2024-07-26 DIAGNOSIS — M26621 Arthralgia of right temporomandibular joint: Secondary | ICD-10-CM

## 2024-07-27 NOTE — Telephone Encounter (Signed)
 Patient called and made aware of the information provided by [facility]. She thanked me for calling and verbalized understanding. All (if any) questions were answered.

## 2024-07-27 NOTE — Telephone Encounter (Signed)
 Called patient and informed her why I was calling. She stated that she did not need a refill that she picked up the Rx when written last week by [facility]. I thanked her for taking my call and informed her that I will be refusing the request.

## 2024-08-06 NOTE — Addendum Note (Signed)
 Addended by: KATHEEN GARDEN L on: 08/06/2024 12:39 PM   Modules accepted: Orders

## 2024-08-24 ENCOUNTER — Other Ambulatory Visit: Payer: Self-pay | Admitting: Medical Genetics

## 2024-08-24 DIAGNOSIS — Z006 Encounter for examination for normal comparison and control in clinical research program: Secondary | ICD-10-CM

## 2024-08-30 ENCOUNTER — Encounter: Payer: Self-pay | Admitting: Orthopedic Surgery

## 2024-08-30 ENCOUNTER — Other Ambulatory Visit (INDEPENDENT_AMBULATORY_CARE_PROVIDER_SITE_OTHER): Payer: Self-pay

## 2024-08-30 ENCOUNTER — Ambulatory Visit: Admitting: Orthopedic Surgery

## 2024-08-30 DIAGNOSIS — M25551 Pain in right hip: Secondary | ICD-10-CM

## 2024-08-30 NOTE — Progress Notes (Signed)
 Office Visit Note   Patient: Elizabeth Thornton           Date of Birth: [birth year]           MRN: [id number] Visit Date: 08/30/2024 Requested by: Katheen Roselie Rockford, NP [address] [place],  [place] [address] PCP: Katheen Roselie Rockford, NP  Subjective: Chief Complaint  Patient presents with   Right Hip - Pain   hip pain    HPI: Jamiracle Ashurst is a 53 y.o. female who presents to the office reporting right hip pain.  Been severe over the past 9 months.  Does wake her from sleep at night.  Hard for her to lay down on that right side.  She has to let the leg hang in order to get some relief.  Does report a lot of pain and tenderness around the trochanteric region with no groin pain and no numbness and tingling extending down the leg.  She is using a massage gun without much relief.  Has lost 60 pounds since April.  Did have bypass surgery for weight 13 years ago.  Was diagnosed with rheumatoid arthritis in January 2025.  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  Does describe decreased sensation around her hip region due to the surgery for skin removal.  Tizanidine  helps some..                ROS: All systems reviewed are negative as they relate to the chief complaint within the history of present illness.  Patient denies fevers or chills.  Assessment & Plan: Visit Diagnoses:  1. Right hip pain     Plan: Impression is right hip pain.  This looks like trochanteric bursitis.  She wants to try some iliotibial band stretching exercises.  I showed her how to do some of those in the exam room.  If that does not help then we could consider injection under ultrasound guidance into the trochanteric bursal region.  Radiographs today do not show much in terms of arthritis.  Follow-Up Instructions: No follow-ups on file.   Orders:  Orders Placed This Encounter  Procedures   XR HIP UNILAT W OR W/O PELVIS 2-3 VIEWS RIGHT   No orders of the defined types were  placed in this encounter.     Procedures: No procedures performed   Clinical Data: No additional findings.  Objective: Vital Signs: There were no vitals taken for this visit.  Physical Exam:  Constitutional: Patient appears well-developed HEENT:  Head: Normocephalic Eyes:EOM are normal Neck: Normal range of motion Cardiovascular: Normal rate Pulmonary/chest: Effort normal Neurologic: Patient is alert Skin: Skin is warm Psychiatric: Patient has normal mood and affect  Ortho Exam: Ortho exam demonstrates normal gait and alignment.  Mild trochanteric tenderness is present on the right but not the left.  Patient has excellent abduction adduction and hip flexion strength.  No groin pain with internal or external rotation of either leg.  Pedal pulses palpable.  Ankle dorsiflexion plantarflexion quad and hamstring strength is intact.  No paresthesias L1-S1 bilaterally.  Specialty Comments:  No specialty comments available.  Imaging: XR HIP UNILAT W OR W/O PELVIS 2-3 VIEWS RIGHT Result Date: 08/30/2024 AP pelvis lateral radiographs right hip reviewed.  No arthritis.  No fracture.  Joint spaces maintained bilaterally.    PMFS History: Patient Active Problem List   Diagnosis Date Noted   Arthralgia of right temporomandibular joint 07/11/2024   Iron  deficiency anemia 09/28/2023  Polyarthralgia 09/23/2023   Seropositive rheumatoid arthritis (HCC) 09/23/2023   DDD (degenerative disc disease), cervical 06/21/2023   Binge eating disorder 06/21/2023   Abnormal weight gain 05/28/2020   GERD (gastroesophageal reflux disease) 10/16/2019   Asthma 08/21/2019   Lipodystrophy 02/18/2017   Excess skin of abdominal wall 02/17/2017   History of DVT of lower extremity 08/26/2016   Primary osteoarthritis of left knee 08/26/2016   Fibromyalgia 10/20/2012   Bilateral bunions 10/20/2012   History of Roux-en-Y gastric bypass 07/14/2012   Generalized osteoarthrosis 06/23/2012   Vitamin D   deficiency 06/23/2012   Past Medical History:  Diagnosis Date   Allergy    Arthritis    Asthma    GERD (gastroesophageal reflux disease)    Hypertension     Family History  Problem Relation Age of Onset   Cancer Mother 97       uterine   Arthritis Mother    Hypertension Mother    Hyperlipidemia Mother    Diabetes Mother    Antithrombin III deficiency Mother    Obesity Mother    Hypertension Father    Hyperlipidemia Father    Diabetes Father    Arthritis Father    Kidney disease Father    Obesity Brother    Cancer Maternal Aunt 25       melanoma with mets to uterine, kidney, lung, brain   Cancer Maternal Uncle 60       stomach   Antithrombin III deficiency Maternal Uncle    Arthritis Maternal Grandmother    Cancer Maternal Grandmother 93       liver   Arthritis Maternal Grandfather    Stroke Maternal Grandfather    Arthritis Paternal Grandmother    Diabetes Paternal Grandmother    Arthritis Paternal Grandfather     Past Surgical History:  Procedure Laterality Date   BUNIONECTOMY Left    CYST EXCISION     SKIN OF RIGHT UPPER BACK   MENISCUS REPAIR Bilateral    2006 and 2008   roux n y  06/26/2012   gastric by pass   Social History   Occupational History   Not on file  Tobacco Use   Smoking status: Former    Current packs/day: 0.00    Average packs/day: 1.5 packs/day for 15.0 years (22.5 ttl pk-yrs)    Types: Cigarettes    Start date: 02/14/1992    Quit date: 02/14/2007    Years since quitting: 17.5   Smokeless tobacco: Never  Vaping Use   Vaping status: Never Used  Substance and Sexual Activity   Alcohol use: No   Drug use: Never   Sexual activity: Yes    Birth control/protection: I.U.D.    Comment: mirena

## 2024-09-03 ENCOUNTER — Encounter: Payer: Self-pay | Admitting: Radiology

## 2024-10-02 LAB — GENECONNECT MOLECULAR SCREEN

## 2024-10-04 ENCOUNTER — Telehealth: Payer: Self-pay | Admitting: Medical Genetics

## 2024-10-04 ENCOUNTER — Telehealth: Admitting: Nurse Practitioner

## 2024-10-05 ENCOUNTER — Encounter: Payer: Self-pay | Admitting: Nurse Practitioner

## 2024-10-05 ENCOUNTER — Ambulatory Visit: Admitting: Nurse Practitioner

## 2024-10-05 VITALS — BP 118/72 | HR 68 | Temp 97.7°F | Ht 62.0 in | Wt 144.6 lb

## 2024-10-05 DIAGNOSIS — J209 Acute bronchitis, unspecified: Secondary | ICD-10-CM

## 2024-10-05 DIAGNOSIS — J00 Acute nasopharyngitis [common cold]: Secondary | ICD-10-CM | POA: Diagnosis not present

## 2024-10-05 MED ORDER — GUAIFENESIN ER 600 MG PO TB12: 600.0000 mg | ORAL_TABLET | Freq: Two times a day (BID) | ORAL | Status: AC | PRN

## 2024-10-05 MED ORDER — BENZONATATE 200 MG PO CAPS: 200.0000 mg | ORAL_CAPSULE | Freq: Three times a day (TID) | ORAL | 0 refills | Status: AC | PRN

## 2024-10-05 MED ORDER — ALBUTEROL SULFATE HFA 108 (90 BASE) MCG/ACT IN AERS: 2.0000 | INHALATION_SPRAY | Freq: Four times a day (QID) | RESPIRATORY_TRACT | 0 refills | Status: AC | PRN

## 2024-10-05 MED ORDER — CETIRIZINE HCL 10 MG PO TABS: 10.0000 mg | ORAL_TABLET | Freq: Every day | ORAL | Status: AC

## 2024-10-05 NOTE — Progress Notes (Signed)
 Acute Office Visit  Subjective:    Patient ID: Elizabeth Thornton, female    DOB: [birth year], 53 y.o.   MRN: [id number]  Chief Complaint  Patient presents with   Cough    Started last Wednesday, coughing (productive) worse at night, post nasal drip at night, loss of voice greyish-green phlegm, Bilateral ear and jaw pain    URI  This is a new problem. The current episode started in the past 7 days. The problem has been unchanged. There has been no fever. Associated symptoms include congestion, coughing, headaches, a plugged ear sensation, rhinorrhea, sinus pain, sneezing and a sore throat. Pertinent negatives include no abdominal pain, chest pain, diarrhea, dysuria, ear pain, nausea, swollen glands or vomiting. She has tried nothing for the symptoms.    Outpatient Medications Prior to Visit  Medication Sig   acetaminophen  (TYLENOL ) 500 MG tablet Take 1 tablet (500 mg total) by mouth every 8 (eight) hours as needed.   buPROPion  ER (WELLBUTRIN  SR) 100 MG 12 hr tablet TAKE 1 TABLET DAILY X 1WEEK, THEN 1 TABLET 2 TIMES DAILY CONTINUOUSLY (Patient taking differently: Take 2 tablets (200 mg) in the mornings and 1 tablet (100 mg) in the evenings)   calcium citrate-vitamin D  (CITRACAL+D) 315-200 MG-UNIT per tablet Take by mouth.   Cholecalciferol 25 MCG (1000 UT) tablet Take 1,000 Units by mouth 1 day or 1 dose.   FLUoxetine (PROZAC) 40 MG capsule Take 40 mg by mouth daily.   gabapentin  (NEURONTIN ) 300 MG capsule Take 1 capsule (300 mg total) by mouth at bedtime.   hydroxychloroquine (PLAQUENIL) 200 MG tablet Take 200 mg by mouth daily.   levonorgestrel (MIRENA, 52 MG,) 20 MCG/24HR IUD Mirena 20 mcg/24 hours (6 yrs) 52 mg intrauterine device  Take 1 device by intrauterine route.   multivitamin-iron -minerals-folic acid  (CENTRUM) chewable tablet Frequency:BID   Dosage:0.0     Instructions:  Note:Dose: 1   tiZANidine  (ZANAFLEX ) 4 MG tablet Take 1-2 tablets (4-8 mg total) by mouth at bedtime as needed  for muscle spasms.   topiramate (TOPAMAX) 50 MG tablet Take 50 mg by mouth 1 day or 1 dose.   [DISCONTINUED] albuterol  (VENTOLIN  HFA) 108 (90 Base) MCG/ACT inhaler Inhale 1-2 puffs into the lungs as needed for wheezing or shortness of breath.   No facility-administered medications prior to visit.    Reviewed past medical and social history.  Review of Systems  HENT:  Positive for congestion, rhinorrhea, sinus pain, sneezing and sore throat. Negative for ear pain.   Respiratory:  Positive for cough.   Cardiovascular:  Negative for chest pain.  Gastrointestinal:  Negative for abdominal pain, diarrhea, nausea and vomiting.  Genitourinary:  Negative for dysuria.  Neurological:  Positive for headaches.   Per HPI     Objective:    Physical Exam Vitals and nursing note reviewed.  Constitutional:      General: She is not in acute distress. HENT:     Right Ear: Ear canal and external ear normal. A middle ear effusion is present. Tympanic membrane is not injected, scarred or perforated.     Left Ear: Ear canal and external ear normal. A middle ear effusion is present. Tympanic membrane is not injected, scarred or perforated.     Nose: No nasal tenderness, mucosal edema, congestion or rhinorrhea.     Right Nostril: No occlusion.     Left Nostril: No occlusion.     Right Turbinates: Swollen. Not enlarged or pale.     Left Turbinates: Swollen.  Not enlarged or pale.     Right Sinus: Maxillary sinus tenderness and frontal sinus tenderness present.     Left Sinus: Maxillary sinus tenderness and frontal sinus tenderness present.     Mouth/Throat:     Pharynx: Oropharynx is clear. Uvula midline. Posterior oropharyngeal erythema and postnasal drip present. No pharyngeal swelling, oropharyngeal exudate or uvula swelling.     Tonsils: No tonsillar exudate or tonsillar abscesses.  Eyes:     Extraocular Movements: Extraocular movements intact.     Conjunctiva/sclera: Conjunctivae normal.   Cardiovascular:     Rate and Rhythm: Normal rate and regular rhythm.     Pulses: Normal pulses.     Heart sounds: Normal heart sounds.  Pulmonary:     Effort: Pulmonary effort is normal.     Breath sounds: Normal breath sounds.  Musculoskeletal:     Cervical back: Normal range of motion and neck supple.  Lymphadenopathy:     Cervical: No cervical adenopathy.  Neurological:     Mental Status: She is alert and oriented to person, place, and time.    BP 118/72 (BP Location: Left Arm, Patient Position: Sitting, Cuff Size: Normal)   Pulse 68   Temp 97.7 F (36.5 C) (Oral)   Ht 5' 2 (1.575 m)   Wt 144 lb 9.6 oz (65.6 kg)   SpO2 100%   BMI 26.45 kg/m    No results found for any visits on 10/05/24.     Assessment & Plan:   Problem List Items Addressed This Visit   None Visit Diagnoses       Acute nasopharyngitis    -  Primary   Relevant Medications   guaiFENesin  (MUCINEX ) 600 MG 12 hr tablet   albuterol  (VENTOLIN  HFA) 108 (90 Base) MCG/ACT inhaler   benzonatate  (TESSALON ) 200 MG capsule   cetirizine  (ZYRTEC ) 10 MG tablet     Acute bronchitis, unspecified organism       Relevant Medications   guaiFENesin  (MUCINEX ) 600 MG 12 hr tablet   albuterol  (VENTOLIN  HFA) 108 (90 Base) MCG/ACT inhaler   benzonatate  (TESSALON ) 200 MG capsule   cetirizine  (ZYRTEC ) 10 MG tablet      Meds ordered this encounter  Medications   guaiFENesin  (MUCINEX ) 600 MG 12 hr tablet    Sig: Take 1 tablet (600 mg total) by mouth 2 (two) times daily as needed for cough or to loosen phlegm.    Supervising Provider:   KREMER, WILLIAM ALFRED [[id number]]   albuterol  (VENTOLIN  HFA) 108 (90 Base) MCG/ACT inhaler    Sig: Inhale 2 puffs into the lungs every 6 (six) hours as needed for wheezing or shortness of breath.    Dispense:  8 g    Refill:  0    Supervising Provider:   BERNETA FALLOW ALFRED [[id number]]   benzonatate  (TESSALON ) 200 MG capsule    Sig: Take 1 capsule (200 mg total) by mouth 3 (three) times  daily as needed.    Dispense:  30 capsule    Refill:  0    Supervising Provider:   BERNETA FALLOW ALFRED [[id number]]   cetirizine  (ZYRTEC ) 10 MG tablet    Sig: Take 1 tablet (10 mg total) by mouth at bedtime.    Supervising Provider:   BERNETA FALLOW ALFRED [[id number]]   Unable to send promethazine  Dm due to interaction with fluoxetine. Sent benzonatate  for cough. Ok to alternate with mucinex  Dm Encourage adequate oral hydration. You can use plain Tylenol  for fever, chills and achyness. Use cool mist  humidifier at bedtime to help with nasal congestion and cough.  Return if symptoms worsen or fail to improve.    Roselie Mood, NP

## 2024-10-05 NOTE — Patient Instructions (Signed)
 Unable to send promethazine  Dm due to interaction with fluoxetine. Sent benzonatate  for cough. Ok to alternate with mucinex  Dm Encourage adequate oral hydration. You can use plain Tylenol  for fever, chills and achyness. Use cool mist humidifier at bedtime to help with nasal congestion and cough.

## 2024-10-05 NOTE — Progress Notes (Signed)
 No seen. Appointment switched to inperson This encounter was created in error - please disregard.

## 2024-10-30 NOTE — Telephone Encounter (Signed)
 [facility]  10/30/2024 3:23pm  Attempted to contact participant 3 times via phone call and MyChart message to discuss TNP results and offer a new [facility] order. Participant may contact the research team at anytime to request a new order.   Jordyn Pennstrom, BS [place]  [facility] Clinical Research Specialist II Direct Dial: [phone]  Fax: [phone]

## 2024-11-02 ENCOUNTER — Encounter: Payer: Self-pay | Admitting: Internal Medicine

## 2024-11-02 ENCOUNTER — Encounter: Payer: Self-pay | Admitting: Nurse Practitioner

## 2024-11-02 NOTE — Telephone Encounter (Signed)
 Pt has an upcoming appointment 11/07/24

## 2024-11-07 ENCOUNTER — Ambulatory Visit: Admitting: Internal Medicine

## 2024-11-07 ENCOUNTER — Encounter: Payer: Self-pay | Admitting: Internal Medicine

## 2024-11-07 VITALS — BP 122/72 | HR 73 | Temp 98.6°F | Ht 62.0 in | Wt 148.6 lb

## 2024-11-07 DIAGNOSIS — M26629 Arthralgia of temporomandibular joint, unspecified side: Secondary | ICD-10-CM | POA: Diagnosis not present

## 2024-11-07 MED ORDER — TIZANIDINE HCL 4 MG PO TABS: 4.0000 mg | ORAL_TABLET | Freq: Every evening | ORAL | 0 refills | Status: AC | PRN

## 2024-11-07 NOTE — Progress Notes (Signed)
 " [facility] [address] [place] [address] Dept: [phone] Dept Fax: [phone]  Acute Care Office Visit  Subjective:   Elizabeth Thornton October 21, 1971 11/07/2024  Chief Complaint  Patient presents with   Jaw Pain    (Left jaw) Since march having lock jaw trouble opening mouth, painful to eat brush teeth    HPI:  Discussed the use of AI scribe software for clinical note transcription with the patient, who gave verbal consent to proceed.  History of Present Illness   Elizabeth Thornton is a 54 year old female with rheumatoid arthritis who presents with left-sided jaw pain.  The left-sided jaw pain began in March 2025, initially occurring once a week upon waking, with difficulty opening her mouth. By late July to August, the pain intensified, localized in the left jaw joint. The pain is exacerbated by chewing, talking, and using a straw, and it radiates to her cheekbone and ear, causing earache-like symptoms. She describes the pain as aching in the lower jaw and shocking in nature when irritated.  She has attempted various treatments for relief, including massage and heat, but these have not been effective. Due to her gastric bypass surgery, she cannot take NSAIDs. A mouth guard, used for a month, did not provide relief. She has not seen a dentist recently but recalls a difficult tooth extraction on the affected side two years ago.  She has a history of TMJ diagnosed in her 11s, but it was on the right side, whereas the current issue is on the left. No shock-like pain from the back of the head or neck.  She experiences headaches upon waking and has been under significant stress due to work-related issues, including increased workload and changes in her work schedule. She also traveled to [place]  to assist her father-in-law with dementia.    The following portions of the patient's history were reviewed and updated as appropriate:  past medical history, past surgical history, family history, social history, allergies, medications, and problem list.   Patient Active Problem List   Diagnosis Date Noted   Arthralgia of right temporomandibular joint 07/11/2024   Iron  deficiency anemia 09/28/2023   Polyarthralgia 09/23/2023   Seropositive rheumatoid arthritis (HCC) 09/23/2023   DDD (degenerative disc disease), cervical 06/21/2023   Binge eating disorder 06/21/2023   Abnormal weight gain 05/28/2020   GERD (gastroesophageal reflux disease) 10/16/2019   Asthma 08/21/2019   Lipodystrophy 02/18/2017   Excess skin of abdominal wall 02/17/2017   History of DVT of lower extremity 08/26/2016   Primary osteoarthritis of left knee 08/26/2016   Fibromyalgia 10/20/2012   Bilateral bunions 10/20/2012   History of Roux-en-Y gastric bypass 07/14/2012   Generalized osteoarthrosis 06/23/2012   Vitamin D  deficiency 06/23/2012   Past Medical History:  Diagnosis Date   Allergy    Arthritis    Asthma    GERD (gastroesophageal reflux disease)    Hypertension    Past Surgical History:  Procedure Laterality Date   BUNIONECTOMY Left    CYST EXCISION     SKIN OF RIGHT UPPER BACK   MENISCUS REPAIR Bilateral    2006 and 2008   roux n y  06/26/2012   gastric by pass   Family History  Problem Relation Age of Onset   Cancer Mother 47       uterine   Arthritis Mother    Hypertension Mother    Hyperlipidemia Mother    Diabetes Mother    Antithrombin III deficiency  Mother    Obesity Mother    Hypertension Father    Hyperlipidemia Father    Diabetes Father    Arthritis Father    Kidney disease Father    Obesity Brother    Cancer Maternal Aunt 67       melanoma with mets to uterine, kidney, lung, brain   Cancer Maternal Uncle 60       stomach   Antithrombin III deficiency Maternal Uncle    Arthritis Maternal Grandmother    Cancer Maternal Grandmother 67       liver   Arthritis Maternal Grandfather    Stroke Maternal  Grandfather    Arthritis Paternal Grandmother    Diabetes Paternal Grandmother    Arthritis Paternal Grandfather    Current Medications[1] Allergies[2]   ROS: A complete ROS was performed with pertinent positives/negatives noted in the HPI. The remainder of the ROS are negative.    Objective:   Today's Vitals   11/07/24 1500  BP: 122/72  Pulse: 73  Temp: 98.6 F (37 C)  TempSrc: Temporal  SpO2: 99%  Weight: 148 lb 9.6 oz (67.4 kg)  Height: 5' 2 (1.575 m)    GENERAL: Well-appearing, in NAD. Well nourished.  SKIN: Pink, warm and dry. No rash, lesion, ulceration, or ecchymoses.  HEENT:    HEAD: Normocephalic, non-traumatic. Limited ROM to jaw. Unable to open mouth completely. No clicking , popping.  EYES: Conjunctive pink without exudate.  EARS: External ear w/o redness, swelling, masses, or lesions. EAC clear. TM's intact, translucent w/o bulging, appropriate landmarks visualized.  NOSE: Septum midline w/o deformity. Nares patent, mucosa pink and non-inflamed w/o drainage. No sinus tenderness.  THROAT: Uvula midline. Oropharynx clear. Tonsils non-inflamed w/o exudate. Mucus membranes pink and moist. No visible tooth decay or abscesses. Missing tooth to left lower jaw.  NECK: Trachea midline. No lymphadenopathy.  RESPIRATORY: Chest wall symmetrical. Respirations even and non-labored. EXTREMITIES: Without clubbing, cyanosis, or edema.  PSYCH/MENTAL STATUS: Alert, oriented x 3. Cooperative, appropriate mood and affect.    No results found for any visits on 11/07/24.    Assessment & Plan:  Assessment and Plan    Temporomandibular joint disorder Chronic left-sided jaw pain with limited mouth opening and pain on chewing suggests TMJ disorder. Previous right-side TMJ diagnosis. Stress and workload may contribute. NSAIDs contraindicated due to gastric bypass. - Prescribed Zanaflex  for muscle relaxation. - Recommended Tylenol  for pain management. - Advised heating pad for  symptomatic relief. - Referred to dentist for evaluation and possible cushioned gel pad. - Consider evaluation for trigeminal neuralgia if no improvement.       Meds ordered this encounter  Medications   tiZANidine  (ZANAFLEX ) 4 MG tablet    Sig: Take 1-2 tablets (4-8 mg total) by mouth at bedtime as needed for muscle spasms.    Dispense:  30 tablet    Refill:  0    Supervising Provider:   THOMPSON, AARON B [[id number]]   Orders Placed This Encounter  Procedures   Ambulatory referral to Dentistry    Referral Priority:   Routine    Referral Type:   Consultation    Referral Reason:   Specialty Services Required    Requested Specialty:   Dental General Practice    Number of Visits Requested:   1   Lab Orders  No laboratory test(s) ordered today   No images are attached to the encounter or orders placed in the encounter.  Return if symptoms worsen or fail to improve.  Rosina Senters, FNP     [1]  Current Outpatient Medications:    acetaminophen  (TYLENOL ) 500 MG tablet, Take 1 tablet (500 mg total) by mouth every 8 (eight) hours as needed., Disp: 30 tablet, Rfl: 0   albuterol  (VENTOLIN  HFA) 108 (90 Base) MCG/ACT inhaler, Inhale 2 puffs into the lungs every 6 (six) hours as needed for wheezing or shortness of breath., Disp: 8 g, Rfl: 0   calcium citrate-vitamin D  (CITRACAL+D) 315-200 MG-UNIT per tablet, Take by mouth., Disp: , Rfl:    FLUoxetine (PROZAC) 40 MG capsule, Take 40 mg by mouth daily., Disp: , Rfl:    gabapentin  (NEURONTIN ) 300 MG capsule, Take 1 capsule (300 mg total) by mouth at bedtime., Disp: 90 capsule, Rfl: 3   hydroxychloroquine (PLAQUENIL) 200 MG tablet, Take 200 mg by mouth daily., Disp: , Rfl:    levonorgestrel (MIRENA, 52 MG,) 20 MCG/24HR IUD, Mirena 20 mcg/24 hours (6 yrs) 52 mg intrauterine device  Take 1 device by intrauterine route., Disp: , Rfl:    multivitamin-iron -minerals-folic acid  (CENTRUM) chewable tablet, Frequency:BID   Dosage:0.0     Instructions:   Note:Dose: 1, Disp: , Rfl:    benzonatate  (TESSALON ) 200 MG capsule, Take 1 capsule (200 mg total) by mouth 3 (three) times daily as needed. (Patient not taking: Reported on 11/07/2024), Disp: 30 capsule, Rfl: 0   buPROPion  ER (WELLBUTRIN  SR) 100 MG 12 hr tablet, TAKE 1 TABLET DAILY X 1WEEK, THEN 1 TABLET 2 TIMES DAILY CONTINUOUSLY (Patient taking differently: Take 2 tablets (200 mg) in the mornings and 1 tablet (100 mg) in the evenings), Disp: 180 tablet, Rfl: 2   cetirizine  (ZYRTEC ) 10 MG tablet, Take 1 tablet (10 mg total) by mouth at bedtime. (Patient not taking: Reported on 11/07/2024), Disp: , Rfl:    Cholecalciferol 25 MCG (1000 UT) tablet, Take 1,000 Units by mouth 1 day or 1 dose. (Patient not taking: Reported on 11/07/2024), Disp: , Rfl:    guaiFENesin  (MUCINEX ) 600 MG 12 hr tablet, Take 1 tablet (600 mg total) by mouth 2 (two) times daily as needed for cough or to loosen phlegm. (Patient not taking: Reported on 11/07/2024), Disp: , Rfl:    tiZANidine  (ZANAFLEX ) 4 MG tablet, Take 1-2 tablets (4-8 mg total) by mouth at bedtime as needed for muscle spasms., Disp: 30 tablet, Rfl: 0   topiramate (TOPAMAX) 50 MG tablet, Take 50 mg by mouth 1 day or 1 dose. (Patient not taking: Reported on 11/07/2024), Disp: , Rfl:  [2]  Allergies Allergen Reactions   Codeine    Egg Protein-Containing Drug Products     Other reaction(s): SWELLING Other reaction(s): SWELLING   Kiwi Extract    Nsaids     Had gastric bypass--cant take it.    Other     No blind NG tube and no Sugar--doesn't react well   Tolmetin     Had gastric bypass--cant take it.    "

## 2024-11-15 ENCOUNTER — Encounter: Payer: Self-pay | Admitting: Internal Medicine

## 2024-11-15 DIAGNOSIS — M26629 Arthralgia of temporomandibular joint, unspecified side: Secondary | ICD-10-CM
